# Patient Record
Sex: Male | Born: 1980 | Race: White | Hispanic: No | Marital: Married | Smoking: Former smoker
Health system: Southern US, Community
[De-identification: ages and names within clinical notes are randomized; demographics above are authoritative.]

## PROBLEM LIST (undated history)

## (undated) DIAGNOSIS — L309 Dermatitis, unspecified: Secondary | ICD-10-CM

## (undated) DIAGNOSIS — F419 Anxiety disorder, unspecified: Secondary | ICD-10-CM

## (undated) HISTORY — DX: Dermatitis, unspecified: L30.9

## (undated) HISTORY — DX: Anxiety disorder, unspecified: F41.9

## (undated) HISTORY — PX: EYE SURGERY: SHX253

---

## 2016-03-18 ENCOUNTER — Ambulatory Visit (INDEPENDENT_AMBULATORY_CARE_PROVIDER_SITE_OTHER): Payer: 59 | Admitting: Psychology

## 2016-03-18 DIAGNOSIS — F40248 Other situational type phobia: Secondary | ICD-10-CM

## 2016-04-01 ENCOUNTER — Ambulatory Visit (INDEPENDENT_AMBULATORY_CARE_PROVIDER_SITE_OTHER): Payer: 59 | Admitting: Psychology

## 2016-04-01 DIAGNOSIS — F40248 Other situational type phobia: Secondary | ICD-10-CM | POA: Diagnosis not present

## 2016-04-25 ENCOUNTER — Ambulatory Visit: Payer: 59 | Admitting: Psychology

## 2016-06-20 HISTORY — PX: MENISCUS REPAIR: SHX5179

## 2016-07-12 DIAGNOSIS — Z0000 Encounter for general adult medical examination without abnormal findings: Secondary | ICD-10-CM | POA: Diagnosis not present

## 2016-12-19 DIAGNOSIS — M25562 Pain in left knee: Secondary | ICD-10-CM | POA: Diagnosis not present

## 2017-01-04 DIAGNOSIS — M25562 Pain in left knee: Secondary | ICD-10-CM | POA: Diagnosis not present

## 2017-02-10 DIAGNOSIS — M25562 Pain in left knee: Secondary | ICD-10-CM | POA: Diagnosis not present

## 2017-02-22 DIAGNOSIS — M25562 Pain in left knee: Secondary | ICD-10-CM | POA: Diagnosis not present

## 2017-03-17 DIAGNOSIS — S83282A Other tear of lateral meniscus, current injury, left knee, initial encounter: Secondary | ICD-10-CM | POA: Diagnosis not present

## 2017-04-13 DIAGNOSIS — S83272A Complex tear of lateral meniscus, current injury, left knee, initial encounter: Secondary | ICD-10-CM | POA: Diagnosis not present

## 2017-04-13 DIAGNOSIS — G8918 Other acute postprocedural pain: Secondary | ICD-10-CM | POA: Diagnosis not present

## 2017-04-24 DIAGNOSIS — M25662 Stiffness of left knee, not elsewhere classified: Secondary | ICD-10-CM | POA: Diagnosis not present

## 2017-04-24 DIAGNOSIS — M6281 Muscle weakness (generalized): Secondary | ICD-10-CM | POA: Diagnosis not present

## 2017-05-03 DIAGNOSIS — M25512 Pain in left shoulder: Secondary | ICD-10-CM | POA: Diagnosis not present

## 2017-05-22 DIAGNOSIS — M25512 Pain in left shoulder: Secondary | ICD-10-CM | POA: Diagnosis not present

## 2017-05-30 DIAGNOSIS — M25512 Pain in left shoulder: Secondary | ICD-10-CM | POA: Diagnosis not present

## 2017-06-02 DIAGNOSIS — M25512 Pain in left shoulder: Secondary | ICD-10-CM | POA: Diagnosis not present

## 2017-06-06 DIAGNOSIS — D2362 Other benign neoplasm of skin of left upper limb, including shoulder: Secondary | ICD-10-CM | POA: Diagnosis not present

## 2017-06-06 DIAGNOSIS — L2084 Intrinsic (allergic) eczema: Secondary | ICD-10-CM | POA: Diagnosis not present

## 2017-06-27 DIAGNOSIS — M6281 Muscle weakness (generalized): Secondary | ICD-10-CM | POA: Diagnosis not present

## 2017-06-27 DIAGNOSIS — M25562 Pain in left knee: Secondary | ICD-10-CM | POA: Diagnosis not present

## 2017-07-13 DIAGNOSIS — Z0000 Encounter for general adult medical examination without abnormal findings: Secondary | ICD-10-CM | POA: Diagnosis not present

## 2017-07-14 DIAGNOSIS — M25562 Pain in left knee: Secondary | ICD-10-CM | POA: Diagnosis not present

## 2017-07-14 DIAGNOSIS — M6281 Muscle weakness (generalized): Secondary | ICD-10-CM | POA: Diagnosis not present

## 2017-07-24 DIAGNOSIS — M25562 Pain in left knee: Secondary | ICD-10-CM | POA: Diagnosis not present

## 2017-07-31 DIAGNOSIS — M25562 Pain in left knee: Secondary | ICD-10-CM | POA: Diagnosis not present

## 2017-08-02 DIAGNOSIS — M25562 Pain in left knee: Secondary | ICD-10-CM | POA: Diagnosis not present

## 2017-08-04 DIAGNOSIS — M25562 Pain in left knee: Secondary | ICD-10-CM | POA: Diagnosis not present

## 2017-08-04 DIAGNOSIS — M6281 Muscle weakness (generalized): Secondary | ICD-10-CM | POA: Diagnosis not present

## 2018-07-16 LAB — LAB REPORT - SCANNED
EGFR (Non-African Amer.): 108
PSA, Total: 0.62

## 2020-02-14 ENCOUNTER — Other Ambulatory Visit: Payer: Self-pay | Admitting: Ophthalmology

## 2020-02-14 DIAGNOSIS — H4912 Fourth [trochlear] nerve palsy, left eye: Secondary | ICD-10-CM

## 2020-03-19 ENCOUNTER — Ambulatory Visit
Admission: RE | Admit: 2020-03-19 | Discharge: 2020-03-19 | Disposition: A | Discharge status: Home / Self Care | Payer: Managed Care, Other (non HMO) | Source: Ambulatory Visit | Attending: Ophthalmology | Admitting: Ophthalmology

## 2020-03-19 ENCOUNTER — Other Ambulatory Visit: Payer: Self-pay

## 2020-03-19 DIAGNOSIS — H4912 Fourth [trochlear] nerve palsy, left eye: Secondary | ICD-10-CM

## 2020-03-19 IMAGING — MR MR ORBITS WO/W CM
4 of 6 series · 19 of 48 positions shown · IV contrast (multihance)
Comparison: Brain MRI today reported separately.

CLINICAL DATA: 39-year-old male with double vision. Left-side 4th
nerve palsy. Prior LASIK surgery about 3 years ago.

EXAM:
MRI OF THE ORBITS WITHOUT AND WITH CONTRAST
TECHNIQUE: Multiplanar, multisequence MR imaging of the orbits was performed
both before and after the administration of intravenous contrast.
CONTRAST:  20mL MULTIHANCE GADOBENATE DIMEGLUMINE 529 MG/ML IV SOLN
in conjunction with contrast enhanced imaging of the brain reported
separately.

[Series 14: T1 · coronal · 3.0mm · 0.35mm/px · 3 of 28 slices shown (1 of 2)]
[im 4/28]
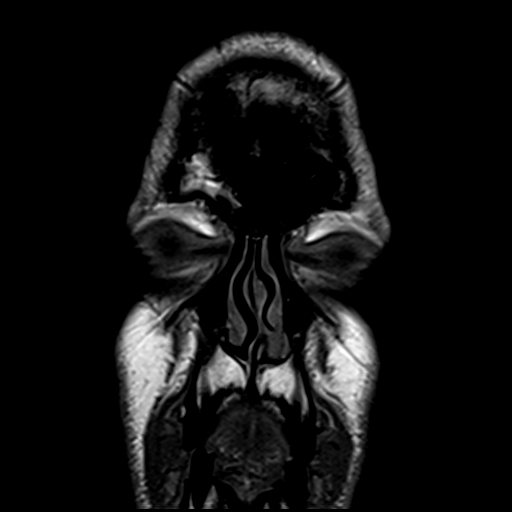
[im 16/28]
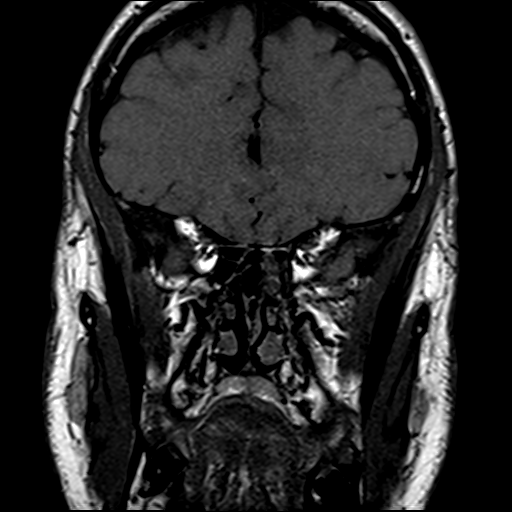
[im 25/28]
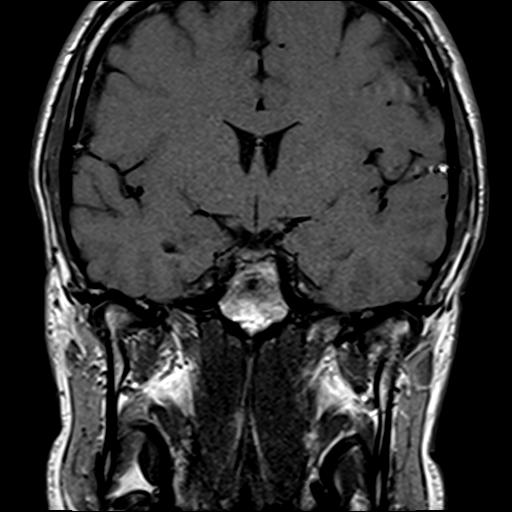

[Series 15: T1 · axial · 3.0mm · 0.35mm/px · z∈[-31,+15]mm · 3 of 18 slices shown (2 of 2)]
[im 4/18]
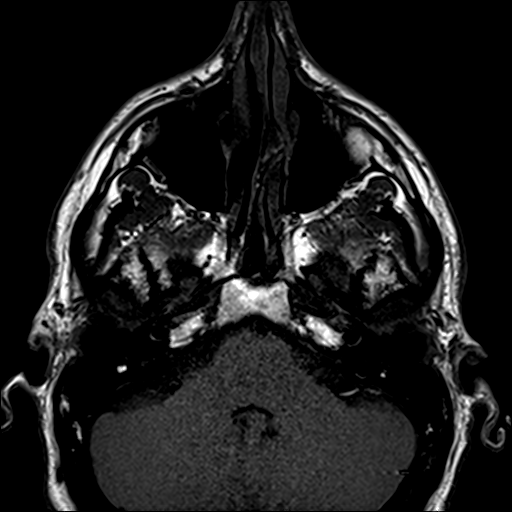
[im 11/18]
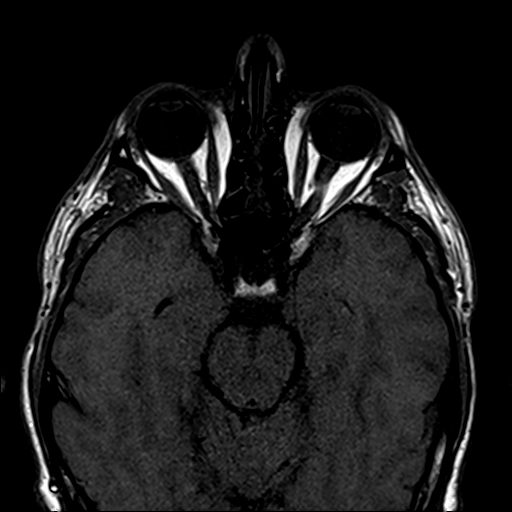
[im 18/18]
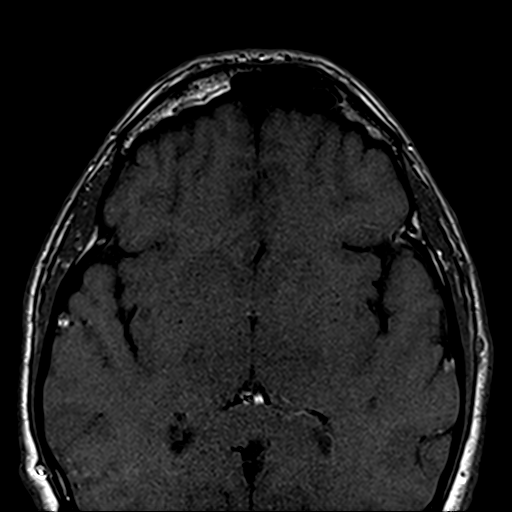

[Series 16: T2 fat-sat · coronal · 3.0mm · 0.35mm/px · 8 of 28 slices shown (1 of 2)]
[im 1/28]
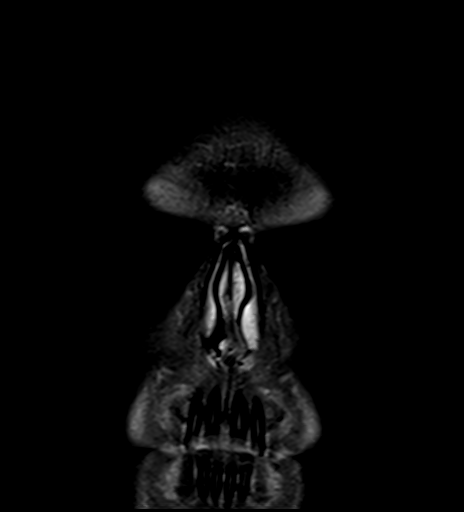
[im 4/28]
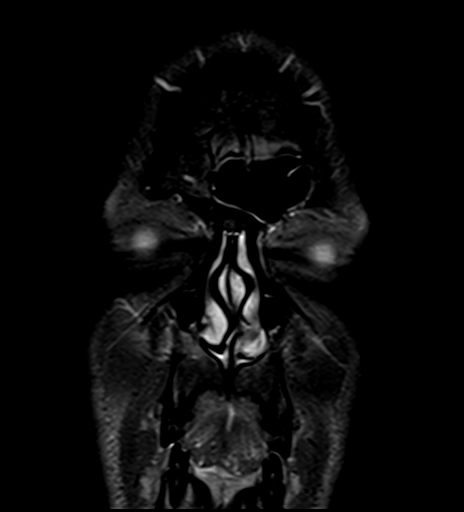
[im 10/28]
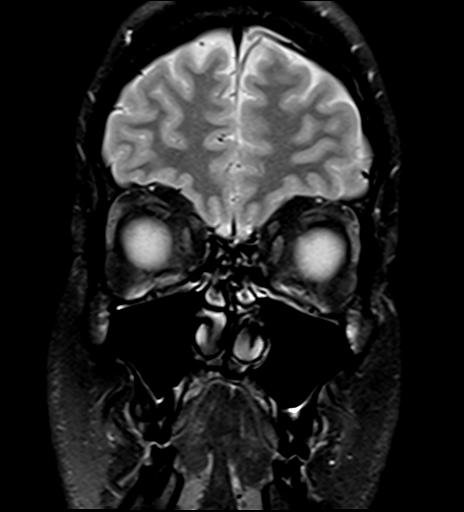
[im 13/28]
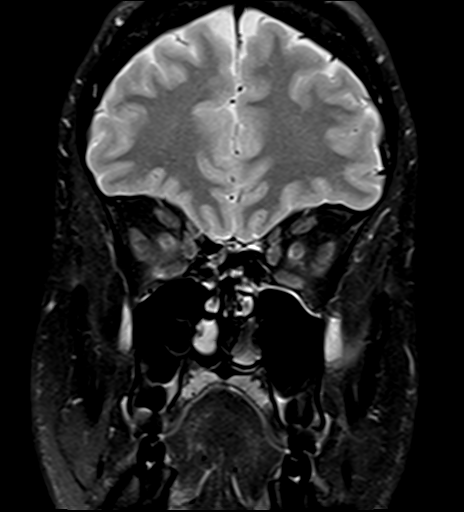
[im 16/28]
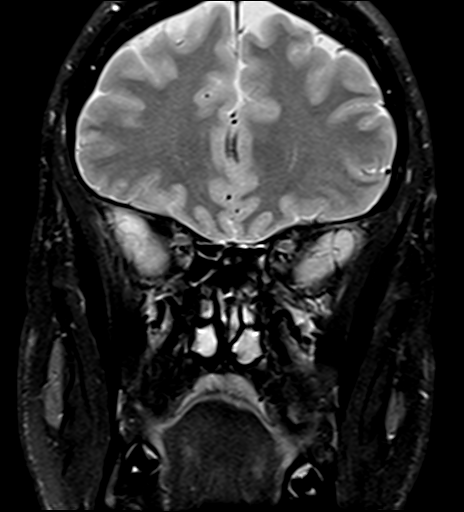
[im 19/28]
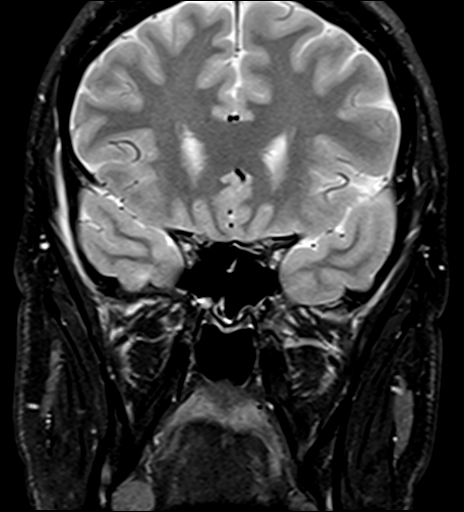
[im 25/28]
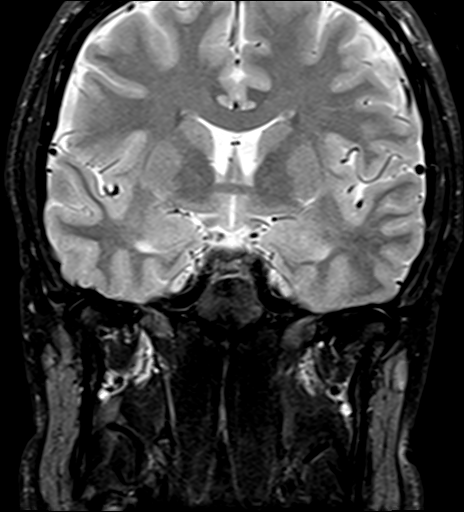
[im 28/28]
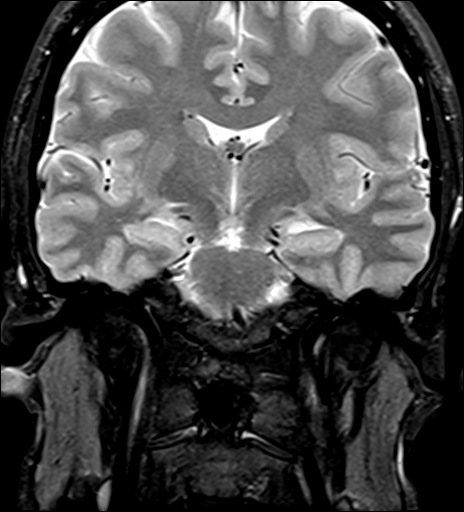

[Series 17: T2 fat-sat · axial · 3.0mm · 0.35mm/px · z∈[-41,+15]mm · 5 of 18 slices shown (2 of 2)]
[im 1/18]
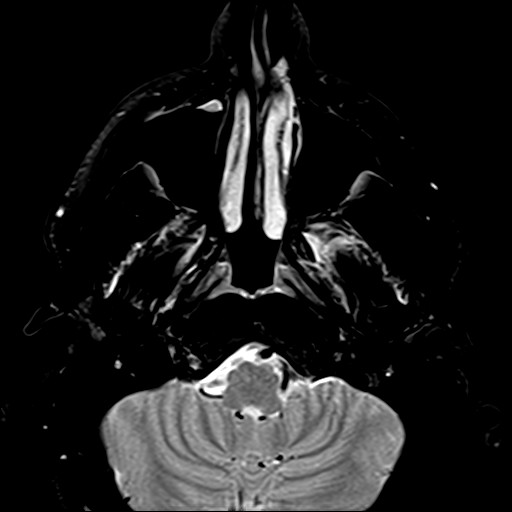
[im 4/18]
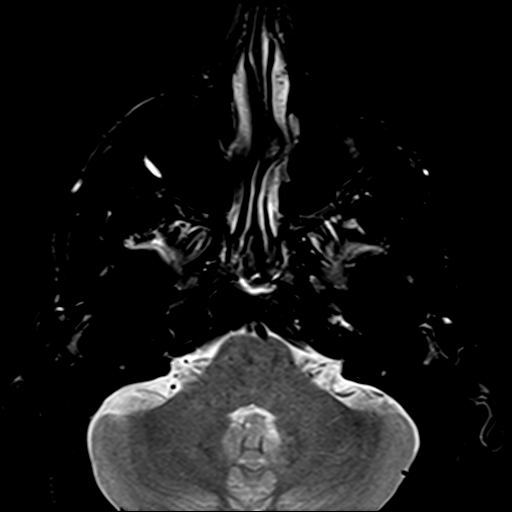
[im 7/18]
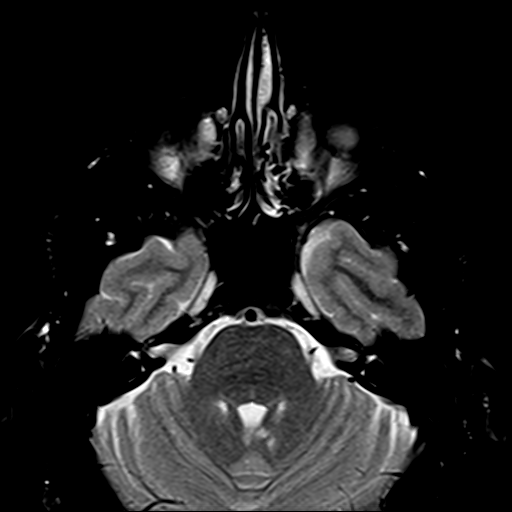
[im 11/18]
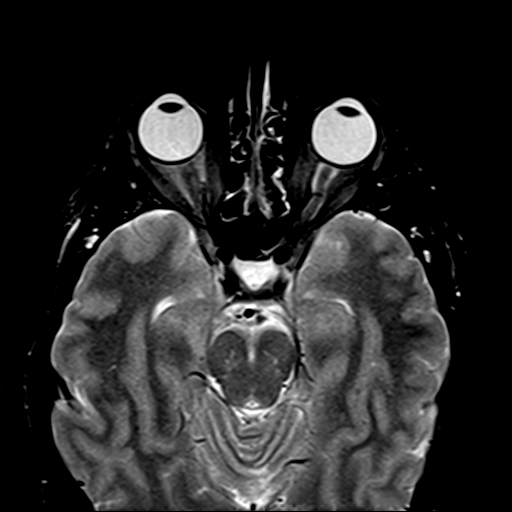
[im 18/18]
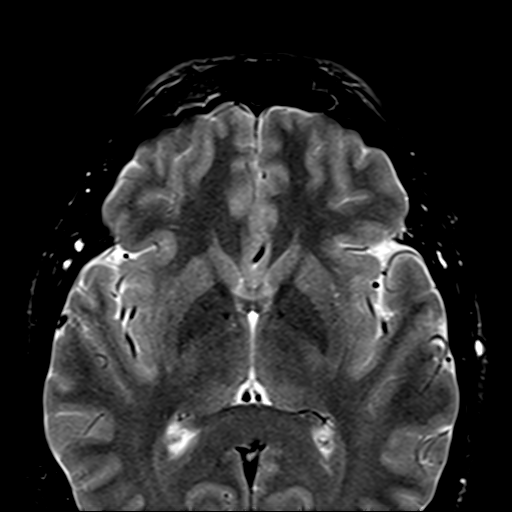

[19 of 48 positions shown; findings below may reference images not displayed]

FINDINGS: Patchy T2 hyperintensity redemonstrated in the dorsal superior
cerebellum near midline (series 17, image 9), see comparison.

No signal abnormality identified in the brainstem, including the
left midbrain. Visible deep gray nuclei also appear normal. No
abnormal enhancement of the visible brain parenchyma.

Normal suprasellar cistern and optic chiasm. Cavernous sinuses
appear symmetric and normal.

Major intracranial vascular flow voids are unremarkable. There is a
left posterior communicating artery identified on series 17 images 6
and 7, with no adverse features evident. There is a smaller right
posterior communicating artery also on image 7. The left ACA A1
segment is mildly tortuous.

Optic nerves and bilateral orbital apex appear symmetric and normal.
No intraorbital mass or abnormal enhancement. Symmetric extra-ocular
muscles and lacrimal glands. No intraorbital inflammation. Globes
appear symmetric and normal.

Trace bilateral paranasal sinus mucosal thickening appears
inconsequential. Superficial periorbital soft tissues as well as the
visible deep soft tissue spaces of the face appear normal.
IMPRESSION: 1. Normal MRI appearance of the orbits. No explanation for left 4th
nerve palsy.

2. The left posterior communicating artery is larger than the right,
but no intracranial aneurysm is evident. If symptoms remain
unexplained, Head MRA (noncontrast) or CTA Head (with contrast)
would best evaluate the vasculature.

## 2020-03-19 IMAGING — MR MR HEAD WO/W CM
12 series · 39 of 48 positions shown · IV contrast (multihance)
Comparison: Orbit MRI today is reported separately.

CLINICAL DATA: 39-year-old male with double vision. Left-side 4th
nerve palsy. Prior LASIK surgery about 3 years ago.

EXAM:
MRI HEAD WITHOUT AND WITH CONTRAST
TECHNIQUE: Multiplanar, multiecho pulse sequences of the brain and surrounding
structures were obtained without and with intravenous contrast.
CONTRAST:  20mL MULTIHANCE GADOBENATE DIMEGLUMINE 529 MG/ML IV SOLN

[Series 2: T1 · sagittal · 5.0mm · 0.45mm/px · 1 of 22 slices shown]
[im 1/22]
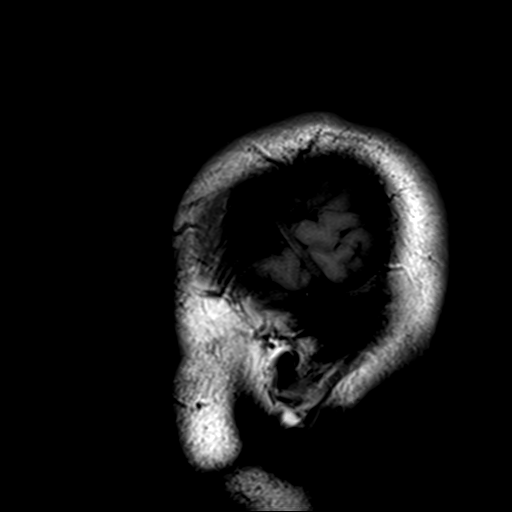

[Series 3: DWI · axial · 3.0mm · 1.88mm/px · z∈[-60,+96]mm · 5 of 106 slices shown (1 of 4)]
[im 1/106]
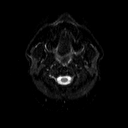
[im 27/106]
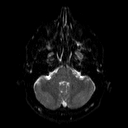
[im 53/106]
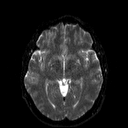
[im 79/106]
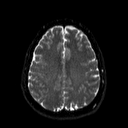
[im 106/106]
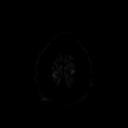

[Series 4: DWI · axial · 3.0mm · 1.88mm/px · z∈[-60,+96]mm · 3 of 52 slices shown (2 of 4)]
[im 1/52]
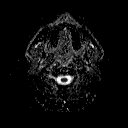
[im 26/52]
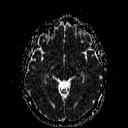
[im 52/52]
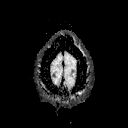

[Series 5: DWI · coronal · 5.0mm · 1.80mm/px · 4 of 72 slices shown (3 of 4)]
[im 1/72]
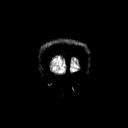
[im 24/72]
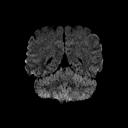
[im 48/72]
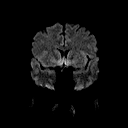
[im 72/72]
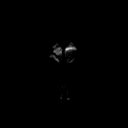

[Series 6: DWI · coronal · 5.0mm · 1.80mm/px · 2 of 36 slices shown (4 of 4)]
[im 1/36]
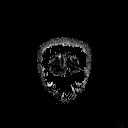
[im 36/36]
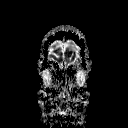

[Series 7: FLAIR · axial · 3.0mm · 0.45mm/px · z∈[-63,+98]mm · 2 of 28 slices shown]
[im 1/28]
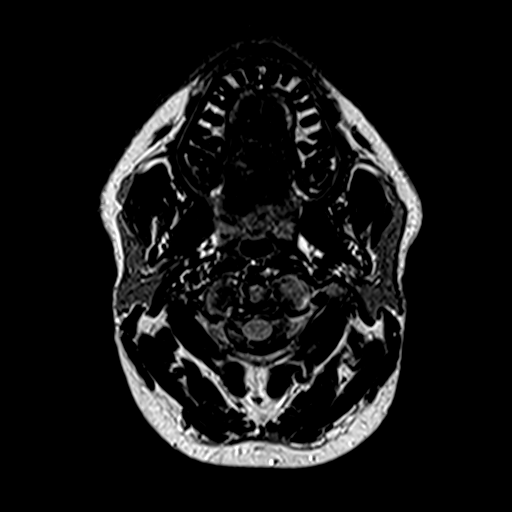
[im 28/28]
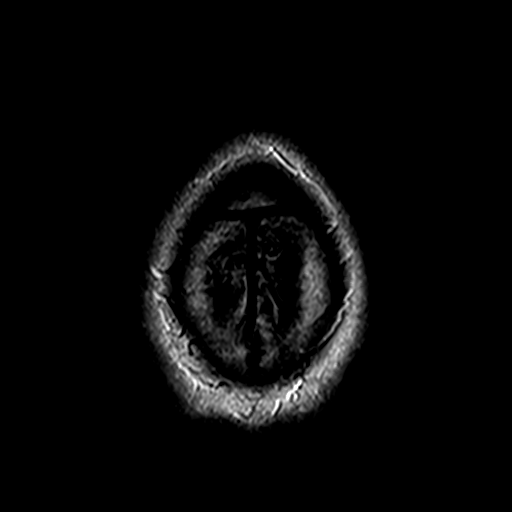

[Series 8: T2 · axial · 5.0mm · 0.51mm/px · z∈[-65,+97]mm · 2 of 27 slices shown (1 of 2)]
[im 1/27]
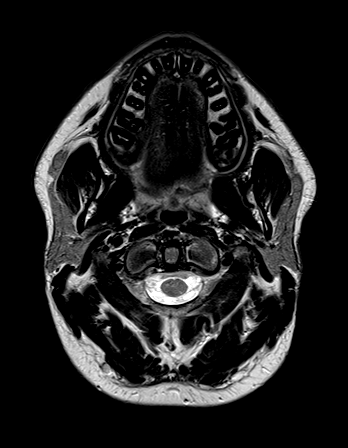
[im 27/27]
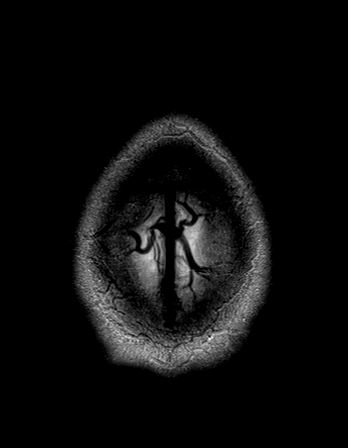

[Series 10: swi_images · axial · 2.0mm · 0.90mm/px · z∈[-63,+95]mm · 5 of 80 slices shown]
[im 1/80]
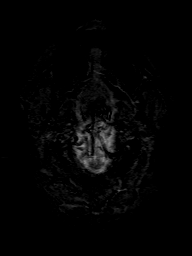
[im 20/80]
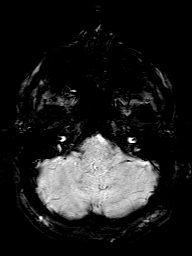
[im 40/80]
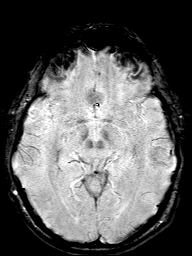
[im 60/80]
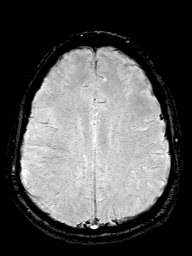
[im 80/80]
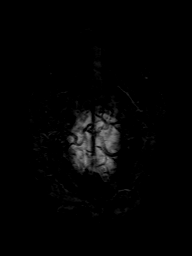

[Series 11: T2 · coronal · 5.0mm · 0.45mm/px · 2 of 30 slices shown (2 of 2)]
[im 1/30]
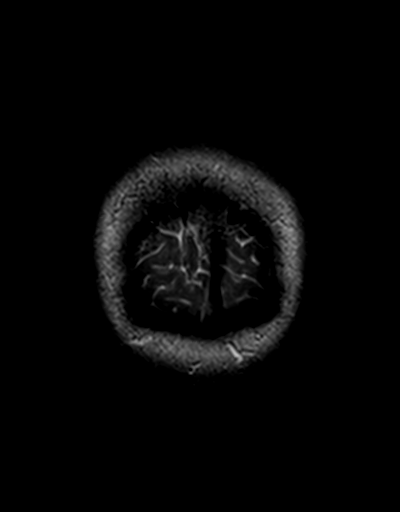
[im 30/30]
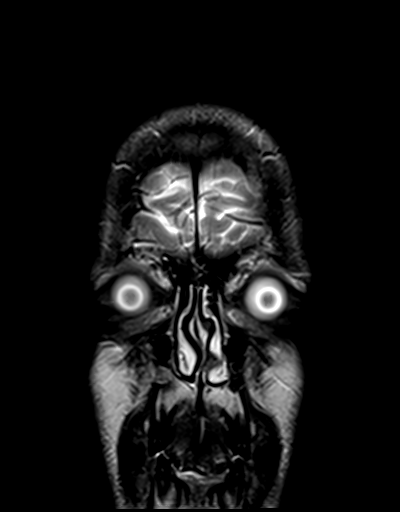

[Series 12: t1_mpr_tra copy center · axial · 1.0mm · 0.45mm/px · z∈[-63,+96]mm · 8 of 160 slices shown]
[im 1/160]
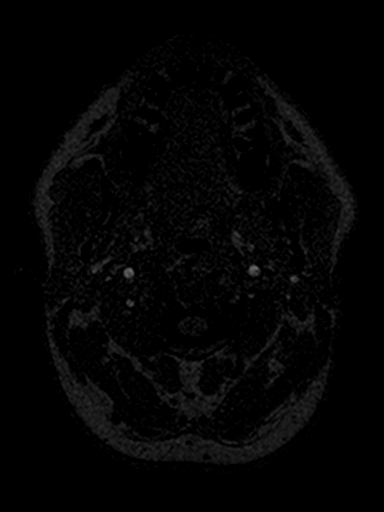
[im 18/160]
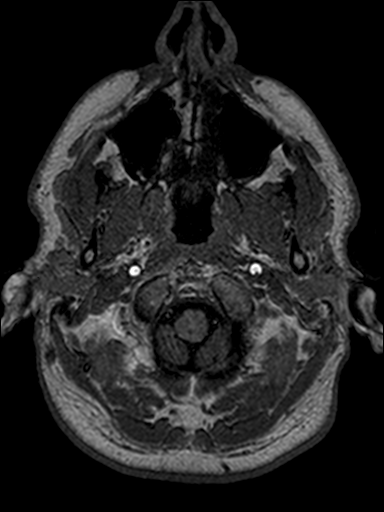
[im 54/160]
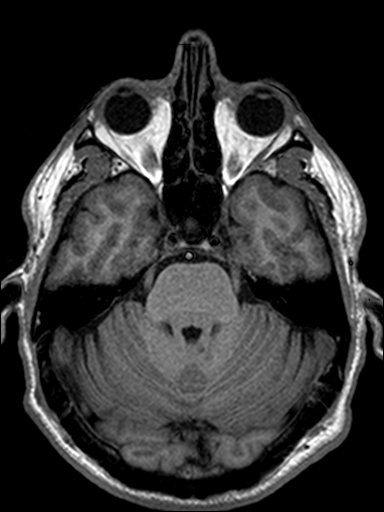
[im 71/160]
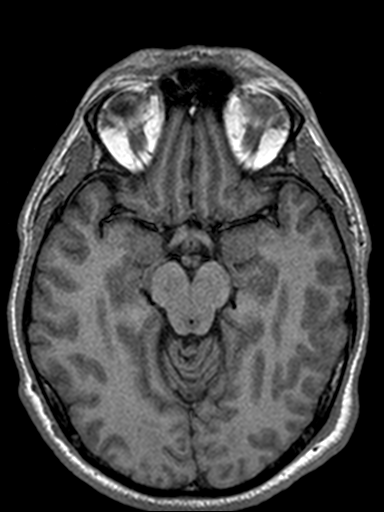
[im 89/160]
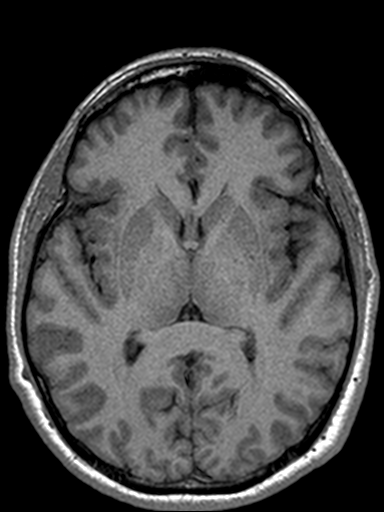
[im 107/160]
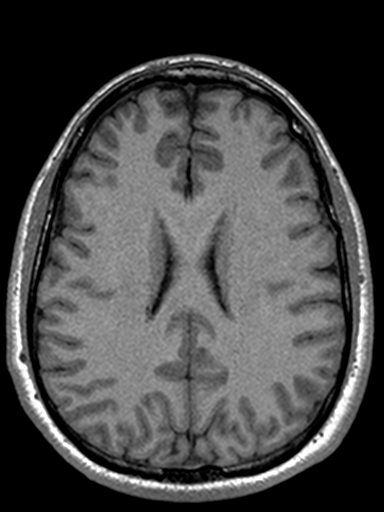
[im 142/160]
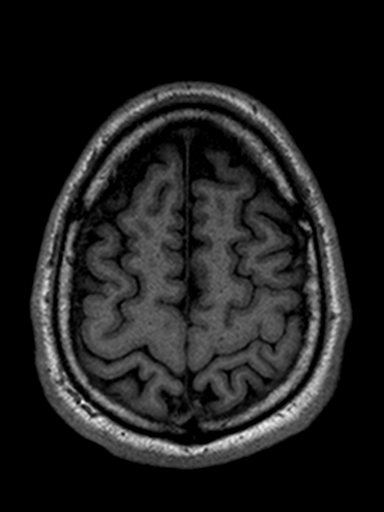
[im 160/160]
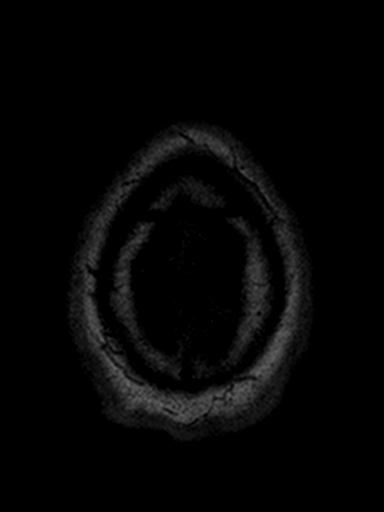

[Series 13: post_t1_mpr_tra · axial · 1.0mm · 0.45mm/px · z∈[-63,-10]mm · 3 of 160 slices shown]
[im 1/160]
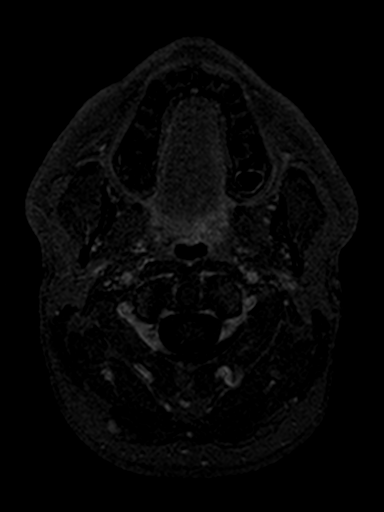
[im 18/160]
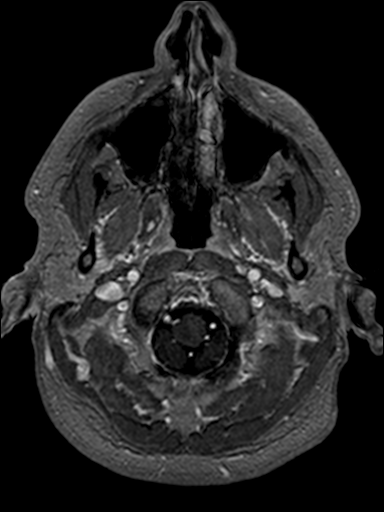
[im 54/160]
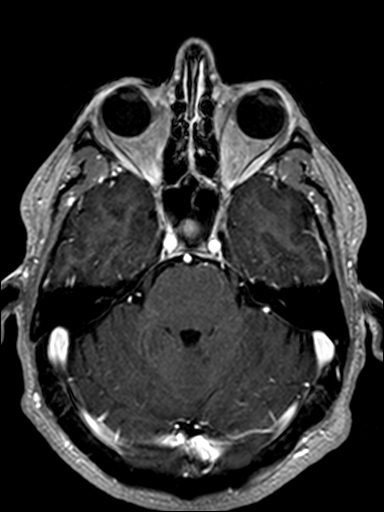

[Series 14: T1 post-contrast · coronal · 5.0mm · 0.45mm/px · 2 of 29 slices shown]
[im 1/29]
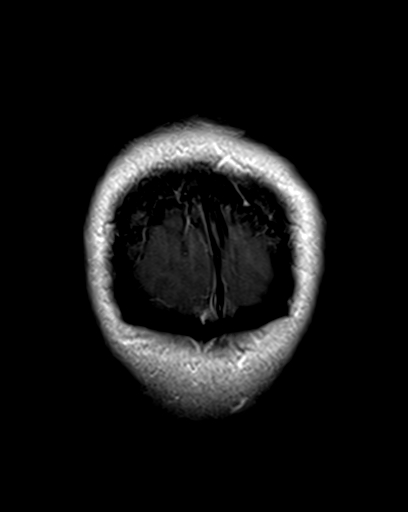
[im 29/29]
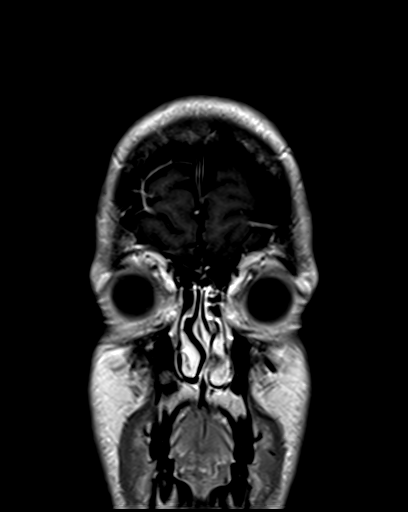

[39 of 48 positions shown; findings below may reference images not displayed]

FINDINGS: Brain: Normal cerebral volume. No restricted diffusion to suggest
acute infarction. No midline shift, mass effect, evidence of mass
lesion, ventriculomegaly, extra-axial collection or acute
intracranial hemorrhage. Cervicomedullary junction and pituitary are
within normal limits.

Supratentorial gray and white matter signal is within normal limits
for age throughout the brain.

No midbrain signal abnormality or lesion on these routine protocol
images, dedicated orbit MRI today is reported separately.

However, there is patchy T2 and FLAIR hyperintensity in the superior
left cerebellum near midline (series 7 images 10 through 12 and
series 11, image 10) tracking inferiorly toward the dorsal 4th
ventricle. No associated chronic cerebral blood products. No
abnormal enhancement there. Diffusion is facilitated. The remainder
of the cerebellum appears normal.

There is a small developmental venous anomaly of the left inferior
cerebellum (series 14, image 10), as well as a more subtle DVA of
the right superior cerebellum on series 14, image 11, but these
appear unrelated to the above signal changes.

There is also a fairly large and conspicuous developmental venous
anomaly of the right hemisphere on series 14 image 13, series 13,
image 117). And these are normal anatomic variations.

No abnormal intracranial enhancement or dural thickening identified.

Vascular: Major intracranial vascular flow voids are preserved. The
major dural venous sinuses are enhancing and appear to be patent.

Skull and upper cervical spine: Negative.

Sinuses/Orbits: Reported separately today.

Other: Mastoids are clear. Visible internal auditory structures
appear normal. Scalp and face appear negative.
IMPRESSION: 1. Orbit MRI is reported separately today.

2. No explanation for left 4th nerve palsy on routine MRI of the
Brain. Although there is an isolated small area of nonspecific
encephalomalacia in the dorsal cerebellum near the midline, which is
of unclear etiology and significance. Elsewhere the brain appears
normal.

3. No acute intracranial abnormality. Normal variant developmental
venous anomalies of the cerebellum and right cerebrum.

## 2020-03-19 MED ORDER — GADOBENATE DIMEGLUMINE 529 MG/ML IV SOLN
20.0000 mL | Freq: Once | INTRAVENOUS | Status: AC | PRN
  Administered 2020-03-19: 20 mL via INTRAVENOUS

## 2020-10-01 ENCOUNTER — Ambulatory Visit: Payer: Self-pay | Admitting: Allergy

## 2020-10-28 NOTE — Progress Notes (Signed)
New Patient Note  RE: William [name] MRN: [id number] DOB: 04-12-1981 Date of Office Visit: 10/29/2020  Consult requested by: No ref. provider found Primary care provider: Kristen Loader, FNP  Chief Complaint: Allergic Rhinitis   History of Present Illness: I had the pleasure of seeing William [name] for initial evaluation at the [facility] on 10/29/2020. He is a 40 y.o. male, who is self-referred here for the evaluation of allergic rhinitis.  He reports symptoms of rhinorrhea, sinus pressure, headaches, nasal congestion, itchy nose, PND, itchy/watery eyes. Symptoms have been going on for 11 years. The symptoms are present mainly in the spring. Other triggers include exposure to possibly pet dander. Anosmia: no. Headache: yes. He has used Claritin, Afrin prn with fair improvement in symptoms. Sinus infections: no. Previous work up includes: none. Previous ENT evaluation: no. Previous sinus imaging: no. History of nasal polyps: no. Last eye exam: last year. History of reflux: none.  Patient had questions about wheat sensitivity as his sister was recently diagnosed with it. He tolerates all food groups with no issues. Discussed that skin testing only checks for IgE mediated reactions which he does not have as he tolerates wheat now with no symptoms.   Assessment and Plan: William [name] is a 40 y.o. male with: Other allergic rhinitis Rhinoconjunctivitis symptoms for 11 years mainly in the spring.  Takes Claritin and Afrin with good benefit.  No prior allergy/ENT evaluation.  Today's skin testing showed: Positive to trees, grass, mold.  Start environmental control measures as below.  May use over the counter antihistamines such as Zyrtec (cetirizine), Claritin (loratadine), Allegra (fexofenadine), or Xyzal (levocetirizine) daily as needed.  Start taking daily from middle of February through May.  Consider allergy injections for long term control if above medications do not  help the symptoms - handout given.    Declines nasal sprays and eyedrops at this time.  Allergic conjunctivitis of both eyes  See assessment and plan as above for allergic rhinitis.  Oral allergy syndrome, subsequent encounter Noted perioral pruritus which cherries, nectarines, kiwi and jackfruit.  Today's skin testing showed: Borderline to peach.  No skin testing available to cherries, nectarines, kiwi and jackfruit in the office.   Continue to avoid foods that are bothersome - nectarines, kiwi, cherry, jackfruit.  Discussed that his food triggered oral and throat symptoms are likely caused by oral food allergy syndrome (OFAS). This is caused by cross reactivity of pollen with fresh fruits and vegetables, and nuts. Symptoms are usually localized in the form of itching and burning in mouth and throat. Very rarely it can progress to more severe symptoms. Eating foods in cooked or processed forms usually minimizes symptoms. I recommended avoidance of eating the problem foods, especially during the peak season(s). Sometimes, OFAS can induce severe throat swelling or even a systemic reaction; with such instance, I advised them to report to a local ER. A list of common pollens and food cross-reactivities was provided to the patient.   Adverse effect of other drugs, medicaments and biological substances, subsequent encounter Broke out in rash a few days after taking penicillin.  No other symptoms.  Consider penicillin allergy skin testing and in office drug challenge in the future.   Return in about 10 months (around 08/29/2021).  No orders of the defined types were placed in this encounter.  Lab Orders  No laboratory test(s) ordered today    Other allergy screening: Asthma: as a child. No inhaler use. Denies any SOB, wheezing,  chest tightness. Food allergy: yes  Cherries, nectarines, kiwi, jackfruit causes perioral pruritus.   Medication allergy: yes  Penicillin - rash on the leg a  few years ago.  Hymenoptera allergy: no Urticaria: no Eczema:no History of recurrent infections suggestive of immunodeficency: no  Diagnostics: Skin Testing: Environmental allergy panel and select foods. Positive to trees, grass, mold. Borderline to peach. Results discussed with patient/family.  Airborne Adult Perc - 10/29/20 1002    Time Antigen Placed 1005    Allergen Manufacturer Lavella Hammock    Location Back    Number of Test 59    1. Control-Buffer 50% Glycerol Negative    2. Control-Histamine 1 mg/ml 2+    3. Albumin saline Negative    4. [place] Negative    5. [place] Negative    6. Johnson Negative    7. Carl Blue Negative    8. Meadow Fescue Negative    9. Perennial Rye Negative    10. Sweet Vernal Negative    11. Timothy Negative    12. Cocklebur Negative    13. Burweed Marshelder Negative    14. Ragweed, short Negative    15. Ragweed, Giant Negative    16. Plantain,  English Negative    17. Lamb's Quarters Negative    18. Sheep Sorrell Negative    19. Rough Pigweed Negative    20. Marsh Elder, Rough Negative    21. Mugwort, Common Negative    22. Ash mix Negative    23. Birch mix 4+    24. Beech American 4+    25. Box, Elder 4+    26. Cedar, red Negative    27. Cottonwood, [place] Negative    28. Elm mix 2+    29. Hickory 2+    30. Maple mix 2+    31. Oak, [place] mix 4+    32. Pecan Pollen Negative    33. Pine mix Negative    34. Sycamore Eastern Negative    35. [place], Black Pollen Negative    36. Alternaria alternata 4+    37. Cladosporium Herbarum Negative    38. Aspergillus mix Negative    39. Penicillium mix Negative    40. Bipolaris sorokiniana (Helminthosporium) Negative    41. Drechslera spicifera (Curvularia) Negative    42. Mucor plumbeus Negative    43. Fusarium moniliforme Negative    44. Aureobasidium pullulans (pullulara) Negative    45. Rhizopus oryzae Negative    46. Botrytis cinera Negative    47. Epicoccum nigrum Negative    48.  Phoma betae Negative    49. Candida Albicans Negative    50. Trichophyton mentagrophytes Negative    51. Mite, D Farinae  5,000 AU/ml Negative    52. Mite, D Pteronyssinus  5,000 AU/ml Negative    53. Cat Hair 10,000 BAU/ml Negative    54.  Dog Epithelia Negative    55. Mixed Feathers Negative    56. Horse Epithelia Negative    57. Cockroach, German Negative    58. Mouse Negative    59. Tobacco Leaf Negative          Intradermal - 10/29/20 1033    Time Antigen Placed 1033    Allergen Manufacturer Lavella Hammock    Location Back    Number of Test 13    Intradermal Select    Control Negative    [place] 3+    Johnson 2+    7 Grass 2+    Ragweed mix Negative    Sherrie Mustache  mix Negative    Mold 2 2+    Mold 3 Negative    Mold 4 --   +/-   Cat Negative    Dog Negative    Cockroach Negative    Mite mix Negative          Food Adult Perc - 10/29/20 1000    Time Antigen Placed 1005    Allergen Manufacturer Greer    Location Back    Number of allergen test 1    59. Peach --   +/-          Past Medical History: Patient Active Problem List   Diagnosis Date Noted  . Other allergic rhinitis 10/29/2020  . Allergic conjunctivitis of both eyes 10/29/2020  . Oral allergy syndrome, subsequent encounter 10/29/2020  . Adverse effect of other drugs, medicaments and biological substances, subsequent encounter 10/29/2020   Past Medical History:  Diagnosis Date  . Eczema    Past Surgical History: History reviewed. No pertinent surgical history. Medication List:  Current Outpatient Medications  Medication Sig Dispense Refill  . ALPRAZolam (XANAX) 1 MG tablet 1 tablet    . naproxen sodium (ALEVE) 220 MG tablet 1-2 tablets with food or milk as needed     No current facility-administered medications for this visit.   Allergies: Allergies  Allergen Reactions  . Penicillins Anaphylaxis and Other (See Comments)   Social History: Social History   Socioeconomic History  . Marital status:  Married    Spouse name: Not on file  . Number of children: Not on file  . Years of education: Not on file  . Highest education level: Not on file  Occupational History  . Not on file  Tobacco Use  . Smoking status: Former Research scientist (life sciences)  . Smokeless tobacco: Never Used  Vaping Use  . Vaping Use: Never used  Substance and Sexual Activity  . Alcohol use: Yes  . Drug use: Never  . Sexual activity: Not on file  Other Topics Concern  . Not on file  Social History Narrative   ** Merged History Encounter **       Social Determinants of Health   Financial Resource Strain: Not on file  Food Insecurity: Not on file  Transportation Needs: Not on file  Physical Activity: Not on file  Stress: Not on file  Social Connections: Not on file   Lives in a 38 year old house. Smoking: denies Occupation: Counselling psychologist HistoryFreight forwarder in the house: no Charity fundraiser in the family room: no Carpet in the bedroom: yes Heating: gas Cooling: central Pet: yes 1 dog x 3 yrs  Family History: Family History  Problem Relation Age of Onset  . Allergic rhinitis Mother   . Allergic rhinitis Sister   . Allergic rhinitis Sister    Review of Systems  Constitutional: Negative for appetite change, chills, fever and unexpected weight change.  HENT: Positive for congestion, postnasal drip, rhinorrhea and sneezing.   Eyes: Positive for itching.  Respiratory: Negative for cough, chest tightness, shortness of breath and wheezing.   Cardiovascular: Negative for chest pain.  Gastrointestinal: Negative for abdominal pain.  Genitourinary: Negative for difficulty urinating.  Skin: Negative for rash.  Allergic/Immunologic: Positive for environmental allergies.  Neurological: Negative for headaches.   Objective: BP 110/70   Pulse 65   Temp 97.7 F (36.5 C) (Temporal)   Resp 16   Ht 5' 1.25" (1.556 m)   Wt 223 lb (101.2 kg)   SpO2 99%  BMI 41.79 kg/m  Body mass index is 41.79  kg/m. Physical Exam Vitals and nursing note reviewed.  Constitutional:      Appearance: Normal appearance. He is well-developed.  HENT:     Head: Normocephalic and atraumatic.     Right Ear: External ear normal.     Left Ear: External ear normal.     Nose: Nose normal.     Mouth/Throat:     Mouth: Mucous membranes are moist.     Pharynx: Oropharynx is clear.  Eyes:     Conjunctiva/sclera: Conjunctivae normal.  Cardiovascular:     Rate and Rhythm: Normal rate and regular rhythm.     Heart sounds: Normal heart sounds. No murmur heard. No friction rub. No gallop.   Pulmonary:     Effort: Pulmonary effort is normal.     Breath sounds: Normal breath sounds. No wheezing, rhonchi or rales.  Abdominal:     Palpations: Abdomen is soft.  Musculoskeletal:     Cervical back: Neck supple.  Skin:    General: Skin is warm.     Findings: No rash.  Neurological:     Mental Status: He is alert and oriented to person, place, and time.  Psychiatric:        Behavior: Behavior normal.    The plan was reviewed with the patient/family, and all questions/concerned were addressed.  It was my pleasure to see William [name] today and participate in his care. Please feel free to contact me with any questions or concerns.  Sincerely,  Rexene Alberts, DO Allergy & Immunology  [facility] office: [place] office: [phone]

## 2020-10-29 ENCOUNTER — Encounter: Payer: Self-pay | Admitting: Allergy

## 2020-10-29 ENCOUNTER — Other Ambulatory Visit: Payer: Self-pay

## 2020-10-29 ENCOUNTER — Ambulatory Visit (INDEPENDENT_AMBULATORY_CARE_PROVIDER_SITE_OTHER): Payer: Commercial Managed Care - PPO | Admitting: Allergy

## 2020-10-29 VITALS — BP 110/70 | HR 65 | Temp 97.7°F | Resp 16 | Ht 61.25 in | Wt 223.0 lb

## 2020-10-29 DIAGNOSIS — T50995D Adverse effect of other drugs, medicaments and biological substances, subsequent encounter: Secondary | ICD-10-CM | POA: Insufficient documentation

## 2020-10-29 DIAGNOSIS — H1013 Acute atopic conjunctivitis, bilateral: Secondary | ICD-10-CM

## 2020-10-29 DIAGNOSIS — J3089 Other allergic rhinitis: Secondary | ICD-10-CM

## 2020-10-29 DIAGNOSIS — T781XXD Other adverse food reactions, not elsewhere classified, subsequent encounter: Secondary | ICD-10-CM

## 2020-10-29 NOTE — Patient Instructions (Addendum)
Today's skin testing showed: Positive to trees, grass, mold. Borderline to peach. Results given.   Environmental allergies  Start environmental control measures as below.  May use over the counter antihistamines such as Zyrtec (cetirizine), Claritin (loratadine), Allegra (fexofenadine), or Xyzal (levocetirizine) daily as needed.  Start taking daily from middle of February through May.  Consider allergy injections for long term control if above medications do not help the symptoms - handout given.   Food:  Continue to avoid foods that are bothersome - nectarines, kiwi, cherry, jackfruit.  Discussed that his food triggered oral and throat symptoms are likely caused by oral food allergy syndrome (OFAS). This is caused by cross reactivity of pollen with fresh fruits and vegetables, and nuts. Symptoms are usually localized in the form of itching and burning in mouth and throat. Very rarely it can progress to more severe symptoms. Eating foods in cooked or processed forms usually minimizes symptoms. I recommended avoidance of eating the problem foods, especially during the peak season(s). Sometimes, OFAS can induce severe throat swelling or even a systemic reaction; with such instance, I advised them to report to a local ER. A list of common pollens and food cross-reactivities was provided to the patient.   Penicillin allergy:  Consider penicillin allergy skin testing and in office drug challenge in the future.   Over 90% of people with history of penicillin allergy which occurred over 10 years ago are found to be non-allergic.   You must be off antihistamines for 3-5 days before. Plan on being in the office for 2-3 hours. You must call to schedule an appointment and specify it's for a drug challenge.   A few days prior to the appointment, I will send in a prescription for amoxicillin liquid which you must bring to the appointment as well.   Follow up in 10 months or sooner if needed.    Reducing Pollen Exposure . Pollen seasons: trees (spring), grass (summer) and ragweed/weeds (fall). Marland Kitchen Keep windows closed in your home and car to lower pollen exposure.  Susa Simmonds air conditioning in the bedroom and throughout the house if possible.  . Avoid going out in dry windy days - especially early morning. . Pollen counts are highest between 5 - 10 AM and on dry, hot and windy days.  . Save outside activities for late afternoon or after a heavy rain, when pollen levels are lower.  . Avoid mowing of grass if you have grass pollen allergy. Marland Kitchen Be aware that pollen can also be transported indoors on people and pets.  . Dry your clothes in an automatic dryer rather than hanging them outside where they might collect pollen.  . Rinse hair and eyes before bedtime.  Mold Control . Mold and fungi can grow on a variety of surfaces provided certain temperature and moisture conditions exist.  . Outdoor molds grow on plants, decaying vegetation and soil. The major outdoor mold, Alternaria and Cladosporium, are found in very high numbers during hot and dry conditions. Generally, a late summer - fall peak is seen for common outdoor fungal spores. Rain will temporarily lower outdoor mold spore count, but counts rise rapidly when the rainy period ends. . The most important indoor molds are Aspergillus and Penicillium. Dark, humid and poorly ventilated basements are ideal sites for mold growth. The next most common sites of mold growth are the bathroom and the kitchen. Outdoor (Seasonal) Mold Control . Use air conditioning and keep windows closed. . Avoid exposure to decaying vegetation. Marland Kitchen  Avoid leaf raking. . Avoid grain handling. . Consider wearing a face mask if working in moldy areas.  Indoor (Perennial) Mold Control  . Maintain humidity below 50%. . Get rid of mold growth on hard surfaces with water, detergent and, if necessary, 5% bleach (do not mix with other cleaners). Then dry the area  completely. If mold covers an area more than 10 square feet, consider hiring an indoor environmental professional. . For clothing, washing with soap and water is best. If moldy items cannot be cleaned and dried, throw them away. . Remove sources e.g. contaminated carpets. . Repair and seal leaking roofs or pipes. Using dehumidifiers in damp basements may be helpful, but empty the water and clean units regularly to prevent mildew from forming. All rooms, especially basements, bathrooms and kitchens, require ventilation and cleaning to deter mold and mildew growth. Avoid carpeting on concrete or damp floors, and storing items in damp areas.

## 2020-10-29 NOTE — Assessment & Plan Note (Signed)
Rhinoconjunctivitis symptoms for 11 years mainly in the spring.  Takes Claritin and Afrin with good benefit.  No prior allergy/ENT evaluation.  Today's skin testing showed: Positive to trees, grass, mold.  Start environmental control measures as below.  May use over the counter antihistamines such as Zyrtec (cetirizine), Claritin (loratadine), Allegra (fexofenadine), or Xyzal (levocetirizine) daily as needed.  Start taking daily from middle of February through May.  Consider allergy injections for long term control if above medications do not help the symptoms - handout given.    Declines nasal sprays and eyedrops at this time.

## 2020-10-29 NOTE — Assessment & Plan Note (Signed)
Broke out in rash a few days after taking penicillin.  No other symptoms.  Consider penicillin allergy skin testing and in office drug challenge in the future.

## 2020-10-29 NOTE — Assessment & Plan Note (Signed)
   See assessment and plan as above for allergic rhinitis.  

## 2020-10-29 NOTE — Assessment & Plan Note (Signed)
Noted perioral pruritus which cherries, nectarines, kiwi and jackfruit.  Today's skin testing showed: Borderline to peach.  No skin testing available to cherries, nectarines, kiwi and jackfruit in the office.   Continue to avoid foods that are bothersome - nectarines, kiwi, cherry, jackfruit.  Discussed that his food triggered oral and throat symptoms are likely caused by oral food allergy syndrome (OFAS). This is caused by cross reactivity of pollen with fresh fruits and vegetables, and nuts. Symptoms are usually localized in the form of itching and burning in mouth and throat. Very rarely it can progress to more severe symptoms. Eating foods in cooked or processed forms usually minimizes symptoms. I recommended avoidance of eating the problem foods, especially during the peak season(s). Sometimes, OFAS can induce severe throat swelling or even a systemic reaction; with such instance, I advised them to report to a local ER. A list of common pollens and food cross-reactivities was provided to the patient.

## 2020-11-27 ENCOUNTER — Ambulatory Visit: Payer: Self-pay | Admitting: Family Medicine

## 2020-12-14 NOTE — Progress Notes (Signed)
[place] Phone: [phone] Subjective:   Fontaine No, am serving as a scribe for Dr. Hulan Saas.  This visit occurred during the SARS-CoV-2 public health emergency.  Safety protocols were in place, including screening questions prior to the visit, additional usage of staff PPE, and extensive cleaning of exam room while observing appropriate contact time as indicated for disinfecting solutions.    I'm seeing this patient by the request  of:  Kristen Loader, FNP  CC: back pain   [id number]  William [name] is a 40 y.o. male coming in with complaint of back pain for past 3 years. Has had MRI that showed herniated disc on two different occasions as over the years his back pain has waxed and waned. Was seen by Percell Miller and Noemi Chapel. Patient has decrease in range of motion in lumbar spine. Did suffer muscle spasm in last week. Able to keep working out but trying to make modifications. Pain mostly on L side of lumbar spine that can travel all the way down to his foot. Takes Aleve and has tried a couple of rounds of PT.       Past Medical History:  Diagnosis Date   Eczema    No past surgical history on file. Social History   Socioeconomic History   Marital status: Married    Spouse name: Not on file   Number of children: Not on file   Years of education: Not on file   Highest education level: Not on file  Occupational History   Not on file  Tobacco Use   Smoking status: Former    Pack years: 0.00   Smokeless tobacco: Never  Vaping Use   Vaping Use: Never used  Substance and Sexual Activity   Alcohol use: Yes   Drug use: Never   Sexual activity: Not on file  Other Topics Concern   Not on file  Social History Narrative   ** Merged History Encounter **       Social Determinants of Health   Financial Resource Strain: Not on file  Food Insecurity: Not on file  Transportation Needs: Not on file   Physical Activity: Not on file  Stress: Not on file  Social Connections: Not on file   Allergies  Allergen Reactions   Penicillins Anaphylaxis and Other (See Comments)   Family History  Problem Relation Age of Onset   Allergic rhinitis Mother    Allergic rhinitis Sister    Allergic rhinitis Sister        Current Outpatient Medications (Analgesics):    naproxen sodium (ALEVE) 220 MG tablet, 1-2 tablets with food or milk as needed   Current Outpatient Medications (Other):    ALPRAZolam (XANAX) 1 MG tablet, 1 tablet   tiZANidine (ZANAFLEX) 2 MG tablet, Take 1 tablet (2 mg total) by mouth at bedtime.   Reviewed prior external information including notes and imaging from  primary care provider As well as notes that were available from care everywhere and other healthcare systems.  Past medical history, social, surgical and family history all reviewed in electronic medical record.  No pertanent information unless stated regarding to the chief complaint.   Review of Systems:  No headache, visual changes, nausea, vomiting, diarrhea, constipation, dizziness, abdominal pain, skin rash, fevers, chills, night sweats, weight loss, swollen lymph nodes, body aches, joint swelling, chest pain, shortness of breath, mood changes. POSITIVE muscle aches  Objective  Blood pressure 118/82, pulse  69, height 6\' 1"  (1.854 m), weight 224 lb (101.6 kg), SpO2 98 %.   General: No apparent distress alert and oriented x3 mood and affect normal, dressed appropriately.  HEENT: Pupils equal, extraocular movements intact  Respiratory: Patient's speak in full sentences and does not appear short of breath  Cardiovascular: No lower extremity edema, non tender, no erythema  Gait normal with good balance and coordination.  MSK:  Non tender with full range of motion and good stability and symmetric strength and tone of shoulders, elbows, wrist, hip, knee and ankles bilaterally.  Significant loss of lordosis.   Patient does have some tightness with [name] the left side.  Tenderness in the paraspinal region of the lumbar spine and the sacroiliac joint.  Minimal range of motion in all planes of the lumbar spine.  Patient does have loss of lordosis.  5-5 strength bilateral lower extremity noted with deep tendon reflexes intact.  [id number]; 15 additional minutes spent for Therapeutic exercises as stated in above notes.  This included exercises focusing on stretching, strengthening, with significant focus on eccentric aspects.   Long term goals include an improvement in range of motion, strength, endurance as well as avoiding reinjury. Patient's frequency would include in 1-2 times a day, 3-5 times a week for a duration of 6-12 weeks. Low back exercises that included:  Pelvic tilt/bracing instruction to focus on control of the pelvic girdle and lower abdominal muscles  Glute strengthening exercises, focusing on proper firing of the glutes without engaging the low back muscles Proper stretching techniques for maximum relief for the hamstrings, hip flexors, low back and some rotation where tolerated  Proper technique shown and discussed handout in great detail with ATC.  All questions were discussed and answered.      Impression and Recommendations:     The above documentation has been reviewed and is accurate and complete Lyndal Pulley, DO

## 2020-12-15 ENCOUNTER — Encounter: Payer: Self-pay | Admitting: Family Medicine

## 2020-12-15 ENCOUNTER — Ambulatory Visit (INDEPENDENT_AMBULATORY_CARE_PROVIDER_SITE_OTHER): Payer: Commercial Managed Care - PPO | Admitting: Family Medicine

## 2020-12-15 ENCOUNTER — Other Ambulatory Visit: Payer: Self-pay

## 2020-12-15 ENCOUNTER — Ambulatory Visit (INDEPENDENT_AMBULATORY_CARE_PROVIDER_SITE_OTHER): Payer: Commercial Managed Care - PPO

## 2020-12-15 VITALS — BP 118/82 | HR 69 | Ht 73.0 in | Wt 224.0 lb

## 2020-12-15 DIAGNOSIS — M545 Low back pain, unspecified: Secondary | ICD-10-CM

## 2020-12-15 DIAGNOSIS — M2550 Pain in unspecified joint: Secondary | ICD-10-CM

## 2020-12-15 DIAGNOSIS — G8929 Other chronic pain: Secondary | ICD-10-CM | POA: Diagnosis not present

## 2020-12-15 LAB — COMPREHENSIVE METABOLIC PANEL
ALT: 14 U/L (ref 0–53)
AST: 19 U/L (ref 0–37)
Albumin: 4.7 g/dL (ref 3.5–5.2)
Alkaline Phosphatase: 50 U/L (ref 39–117)
BUN: 15 mg/dL (ref 6–23)
CO2: 29 mEq/L (ref 19–32)
Calcium: 9.3 mg/dL (ref 8.4–10.5)
Chloride: 102 mEq/L (ref 96–112)
Creatinine, Ser: 0.83 mg/dL (ref 0.40–1.50)
GFR: 111.29 mL/min (ref >60.00)
Glucose, Bld: 72 mg/dL (ref 70–99)
Potassium: 3.6 mEq/L (ref 3.5–5.1)
Sodium: 139 mEq/L (ref 135–145)
Total Bilirubin: 0.6 mg/dL (ref 0.2–1.2)
Total Protein: 7 g/dL (ref 6.0–8.3)

## 2020-12-15 LAB — CBC WITH DIFFERENTIAL/PLATELET
Basophils Absolute: 0 10*3/uL (ref 0.0–0.1)
Basophils Relative: 0.5 % (ref 0.0–3.0)
Eosinophils Absolute: 0.3 10*3/uL (ref 0.0–0.7)
Eosinophils Relative: 3.2 % (ref 0.0–5.0)
HCT: 41.9 % (ref 39.0–52.0)
Hemoglobin: 14.4 g/dL (ref 13.0–17.0)
Lymphocytes Relative: 25.3 % (ref 12.0–46.0)
Lymphs Abs: 2 10*3/uL (ref 0.7–4.0)
MCHC: 34.5 g/dL (ref 30.0–36.0)
MCV: 85.4 fl (ref 78.0–100.0)
Monocytes Absolute: 0.5 10*3/uL (ref 0.1–1.0)
Monocytes Relative: 6.6 % (ref 3.0–12.0)
Neutro Abs: 5.1 10*3/uL (ref 1.4–7.7)
Neutrophils Relative %: 64.4 % (ref 43.0–77.0)
Platelets: 193 10*3/uL (ref 150.0–400.0)
RBC: 4.91 Mil/uL (ref 4.22–5.81)
RDW: 12.5 % (ref 11.5–15.5)
WBC: 7.9 10*3/uL (ref 4.0–10.5)

## 2020-12-15 LAB — SEDIMENTATION RATE: Sed Rate: 5 mm/hr (ref 0–15)

## 2020-12-15 LAB — IBC PANEL
Iron: 123 ug/dL (ref 42–165)
Saturation Ratios: 35.4 % (ref 20.0–50.0)
Transferrin: 248 mg/dL (ref 212.0–360.0)

## 2020-12-15 LAB — C-REACTIVE PROTEIN: CRP: <1 mg/dL (ref 0.5–20.0)

## 2020-12-15 LAB — URIC ACID: Uric Acid, Serum: 5.2 mg/dL (ref 4.0–7.8)

## 2020-12-15 LAB — VITAMIN D 25 HYDROXY (VIT D DEFICIENCY, FRACTURES): VITD: 15.79 ng/mL — ABNORMAL LOW (ref 30.00–100.00)

## 2020-12-15 LAB — TSH: TSH: 1 u[IU]/mL (ref 0.35–4.50)

## 2020-12-15 LAB — TESTOSTERONE: Testosterone: 401.29 ng/dL (ref 300.00–890.00)

## 2020-12-15 IMAGING — DX DG LUMBAR SPINE 2-3V
3 series · 3 of 3 positions shown · non-contrast
Comparison: [DATE].

CLINICAL DATA: Chronic low back pain.

EXAM:
LUMBAR SPINE - 2-3 VIEW

[l-spine ap]
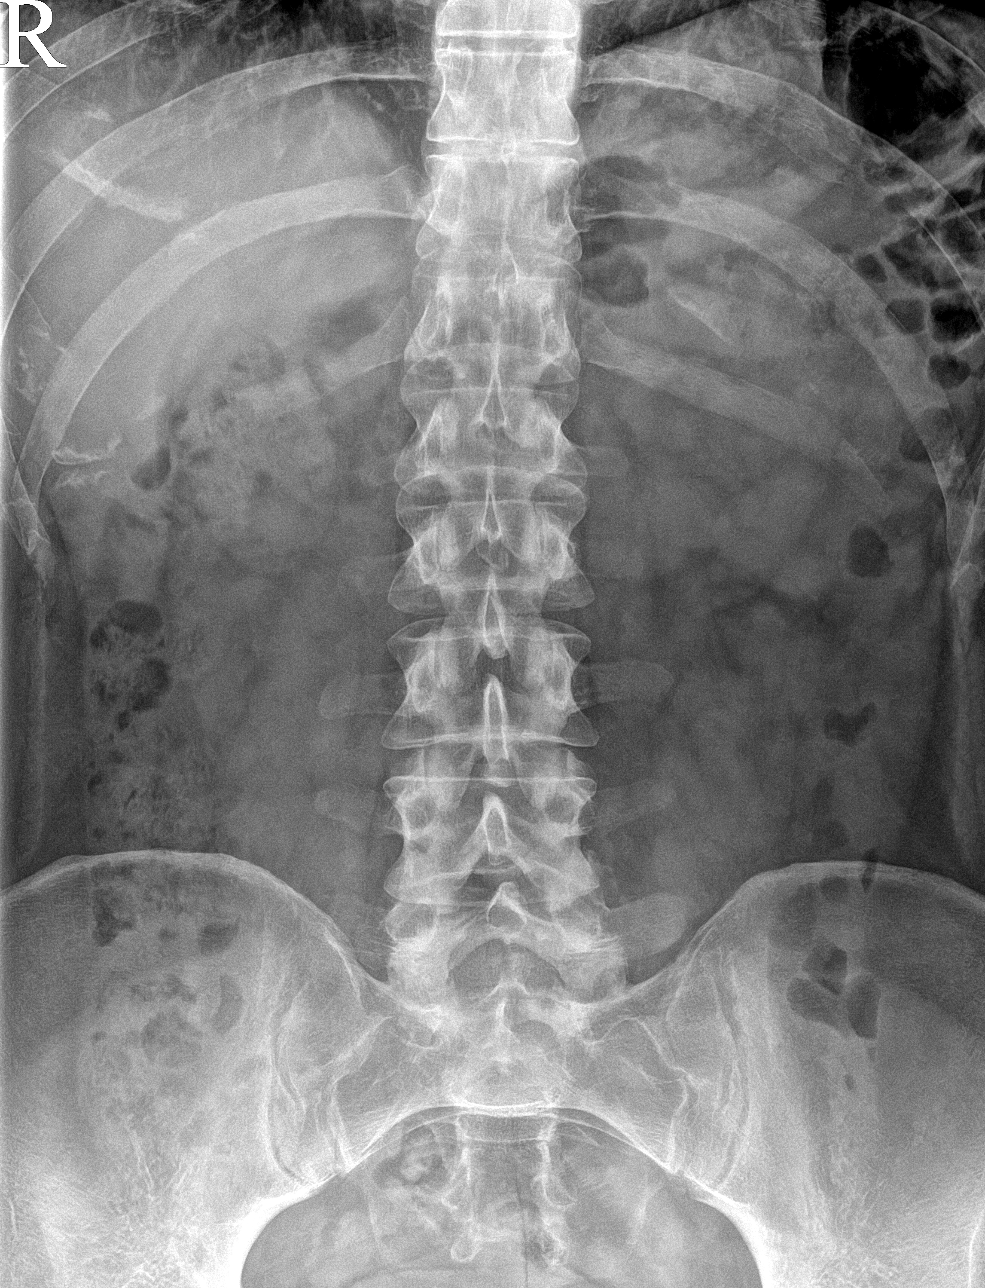

[l-spine lateral (1 of 2)]
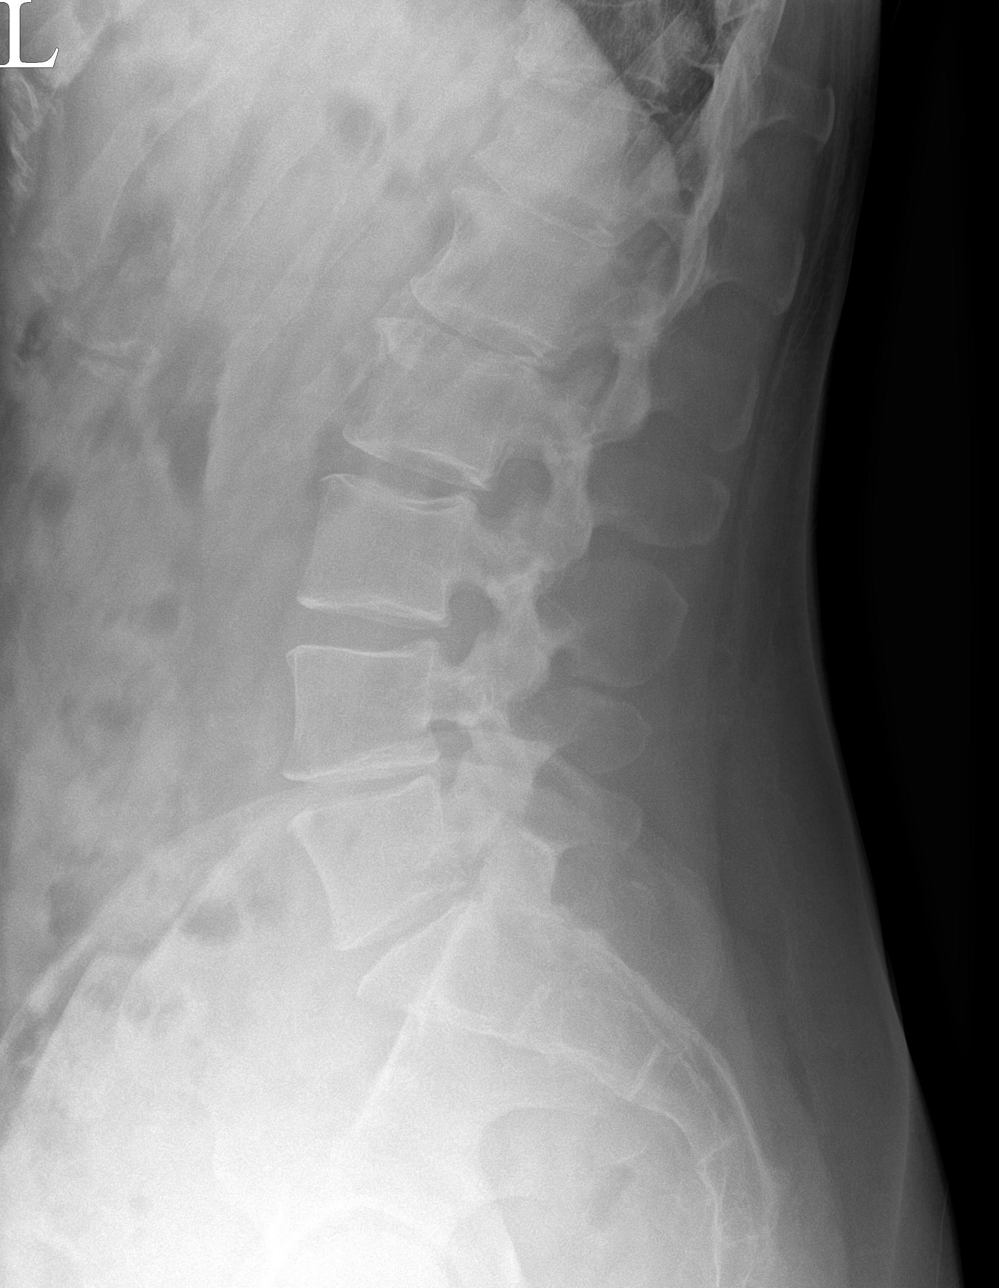

[l-spine lateral (2 of 2)]
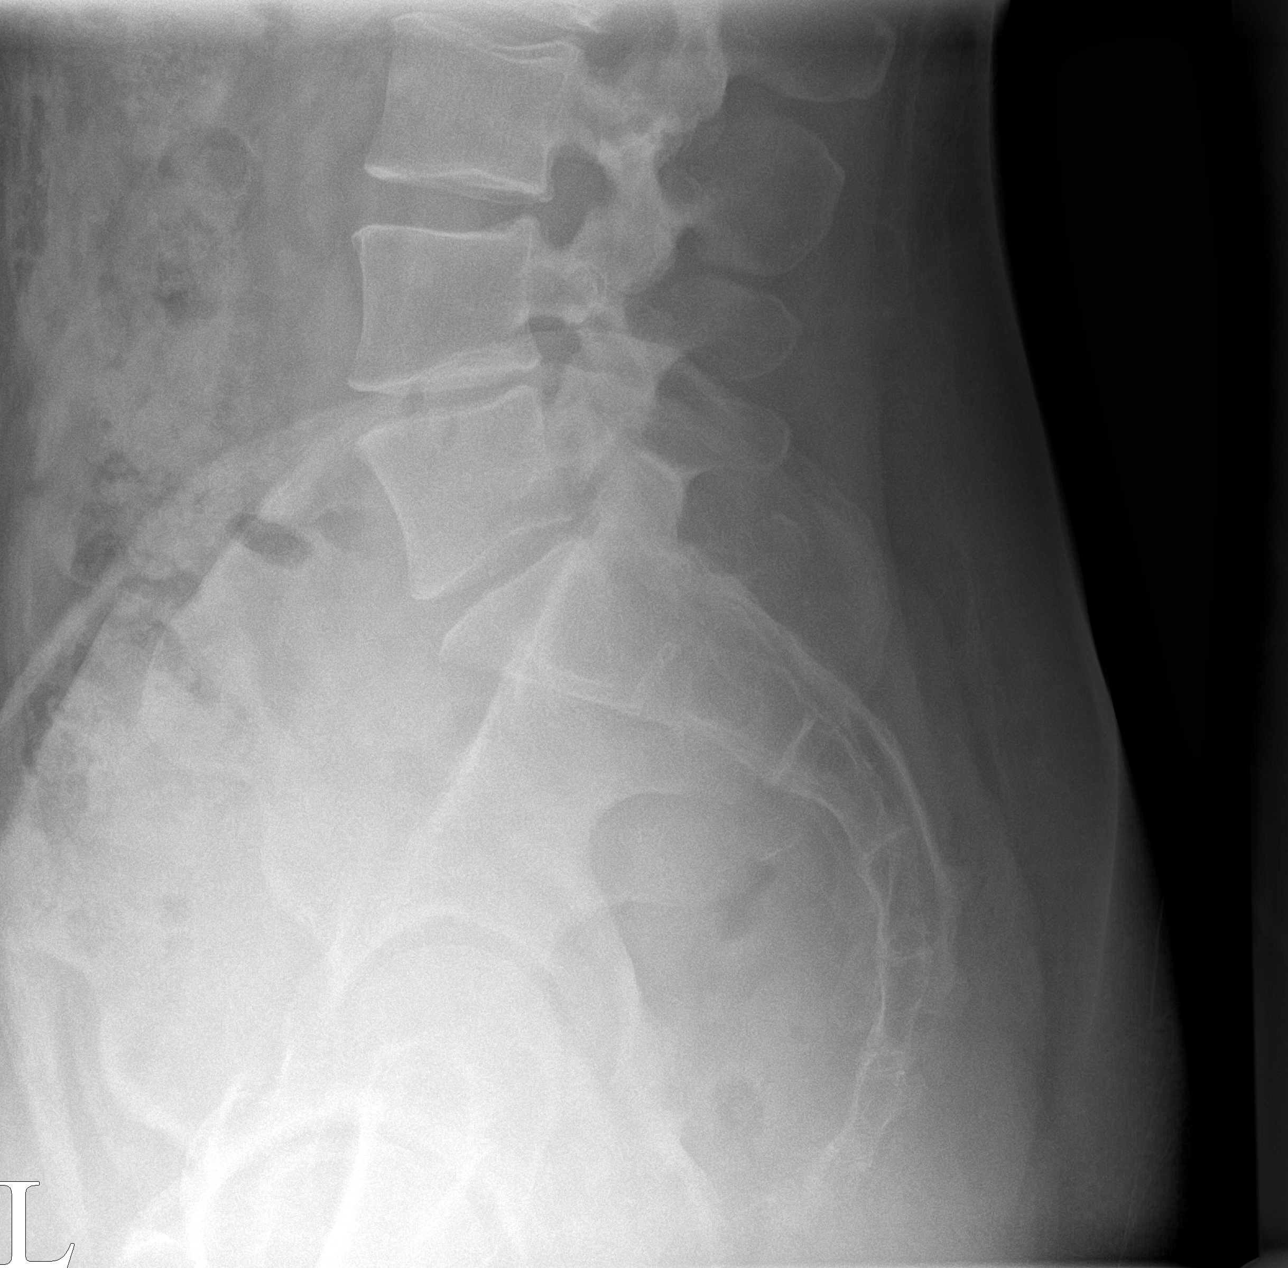

[3 of 3 positions shown; findings below may reference images not displayed]

FINDINGS: No fracture or spondylolisthesis is noted. Moderate degenerative
disc disease is noted at L4-5 and L5-S1.
IMPRESSION: Moderate multilevel degenerative disc disease. No acute abnormality
seen.

## 2020-12-15 MED ORDER — TIZANIDINE HCL 2 MG PO TABS: 2.0000 mg | ORAL_TABLET | Freq: Every day | ORAL | 0 refills | Status: DC

## 2020-12-15 NOTE — Patient Instructions (Addendum)
Xray lumbar Labs test SI joint exercises Zanaflex 2mg  at night when needed See me again in 4-6 weeks and will consider OMT

## 2020-12-15 NOTE — Assessment & Plan Note (Signed)
Patient has 2 histories of the potential for what sounds to be more of 2 episodes of a herniated disc previously.  Now from time to time has more muscle spasms.  Concerned that with the loss of range of motion for symptoms either Sjogren's disease or potentially have calcific changes of the anterior ligamentum flavum.  Discussed with home exercises, icing regimen, which activities to do which wants to avoid.  Patient work with Product/process development scientist today to learn.  I did discuss with patient with a family history of rheumatoid arthritis we will get laboratory work-up to rule out such things as ankylosing spondylitis or rheumatoid arthritis that could be also contributing.  Patient will follow up with me again in 4 to 6 weeks.  Patient is seeing a chiropractor but if he discontinues that can consider the possibility of osteopathic manipulation

## 2020-12-16 ENCOUNTER — Other Ambulatory Visit: Payer: Self-pay

## 2020-12-16 ENCOUNTER — Encounter: Payer: Self-pay | Admitting: Family Medicine

## 2020-12-16 MED ORDER — VITAMIN D (ERGOCALCIFEROL) 1.25 MG (50000 UNIT) PO CAPS: 50000.0000 [IU] | ORAL_CAPSULE | ORAL | 0 refills | Status: DC

## 2020-12-17 LAB — HLA-B27 ANTIGEN: HLA-B27 Antigen: POSITIVE — AB

## 2020-12-17 LAB — CALCIUM, IONIZED: Calcium, Ion: 5.07 mg/dL (ref 4.8–5.6)

## 2020-12-17 LAB — PTH, INTACT AND CALCIUM
Calcium: 9.2 mg/dL (ref 8.6–10.3)
PTH: 45 pg/mL (ref 16–77)

## 2020-12-17 LAB — ANGIOTENSIN CONVERTING ENZYME: Angiotensin-Converting Enzyme: 38 U/L (ref 9–67)

## 2020-12-17 LAB — CYCLIC CITRUL PEPTIDE ANTIBODY, IGG: Cyclic Citrullin Peptide Ab: <16 UNITS

## 2020-12-17 LAB — ANA: Anti Nuclear Antibody (ANA): NEGATIVE

## 2020-12-17 LAB — RHEUMATOID FACTOR: Rheumatoid fact SerPl-aCnc: <14 IU/mL (ref <14)

## 2020-12-18 ENCOUNTER — Encounter: Payer: Self-pay | Admitting: Family Medicine

## 2020-12-22 ENCOUNTER — Other Ambulatory Visit: Payer: Self-pay

## 2020-12-22 DIAGNOSIS — G8929 Other chronic pain: Secondary | ICD-10-CM

## 2020-12-27 ENCOUNTER — Ambulatory Visit
Admission: RE | Admit: 2020-12-27 | Discharge: 2020-12-27 | Disposition: A | Discharge status: Home / Self Care | Payer: Commercial Managed Care - PPO | Source: Ambulatory Visit | Attending: Family Medicine | Admitting: Family Medicine

## 2020-12-27 DIAGNOSIS — G8929 Other chronic pain: Secondary | ICD-10-CM

## 2020-12-27 DIAGNOSIS — M545 Low back pain, unspecified: Secondary | ICD-10-CM

## 2020-12-27 IMAGING — MR MR SACRUM / SI JOINTS WO CM
5 series · 35 of 48 positions shown · non-contrast
Comparison: Lumbar radiographs of [DATE] and lumbar MRI from
[DATE]

CLINICAL DATA: SI joint pain and low back pain

EXAM:
MRI SACRUM WITHOUT CONTRAST
TECHNIQUE: Multiplanar multi-sequence MR imaging of the sacrum was performed.
No intravenous contrast was administered.

[Series 7: T2 fat-sat · axial · 4.0mm · 1.12mm/px · z∈[-175,+93]mm · 9 of 49 slices shown (1 of 2)]
[im 1/49]
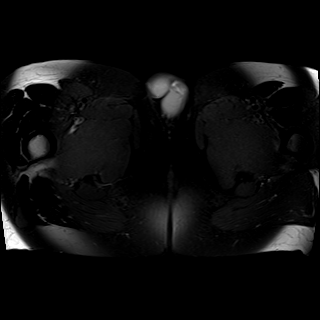
[im 9/49]
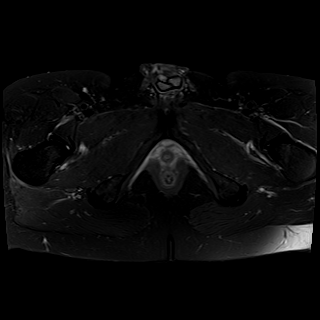
[im 14/49]
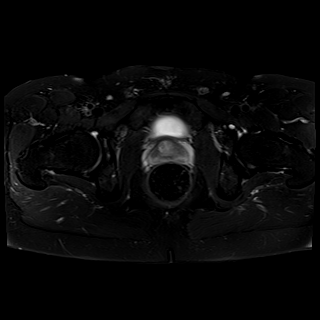
[im 22/49]
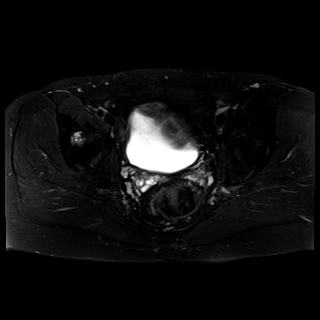
[im 27/49]
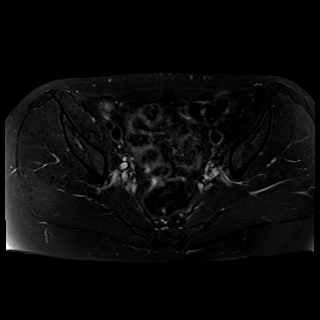
[im 35/49]
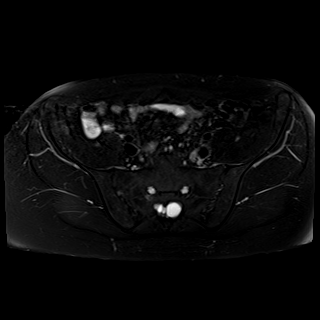
[im 40/49]
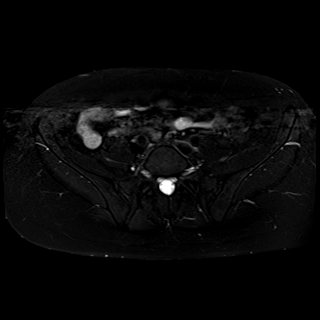
[im 44/49]
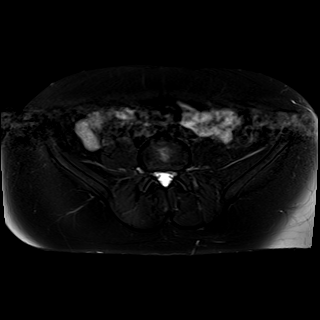
[im 49/49]
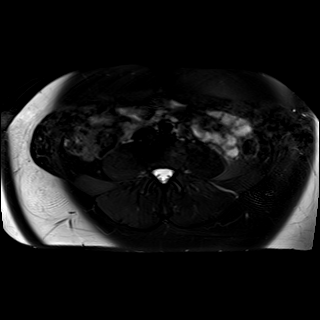

[Series 8: T1 · axial · 4.0mm · 1.12mm/px · z∈[-175,+93]mm · 9 of 49 slices shown (1 of 2)]
[im 1/49]
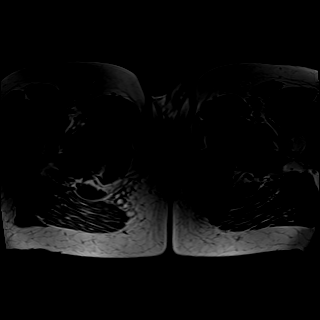
[im 9/49]
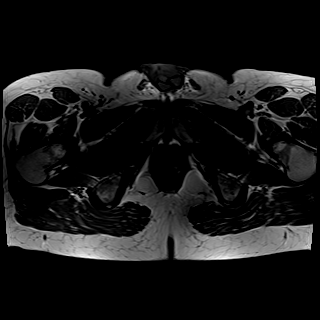
[im 14/49]
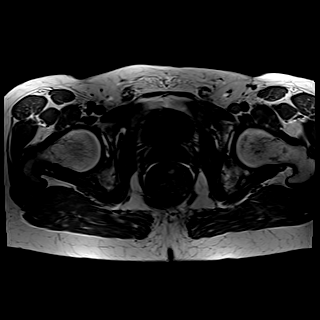
[im 22/49]
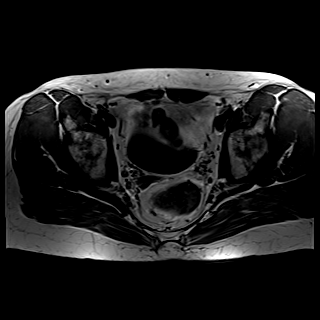
[im 27/49]
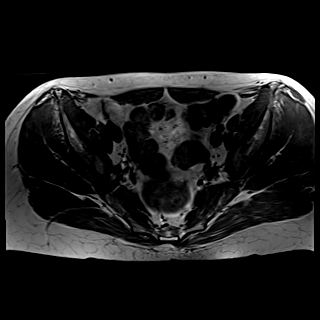
[im 35/49]
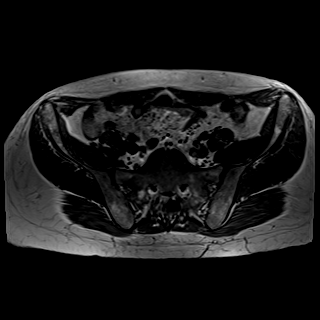
[im 40/49]
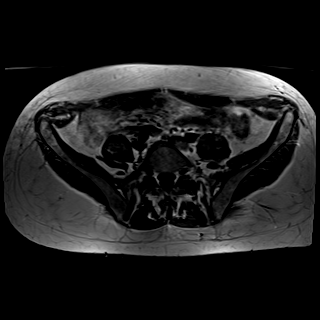
[im 44/49]
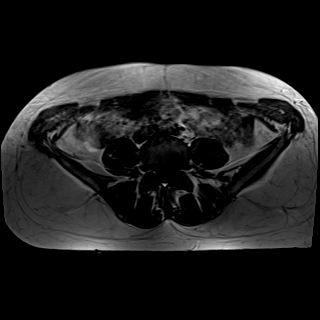
[im 49/49]
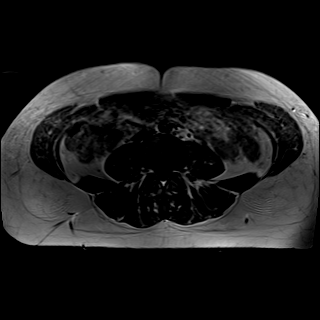

[Series 9: T1 · coronal · 4.0mm · 0.59mm/px · 8 of 33 slices shown (2 of 2)]
[im 1/33]
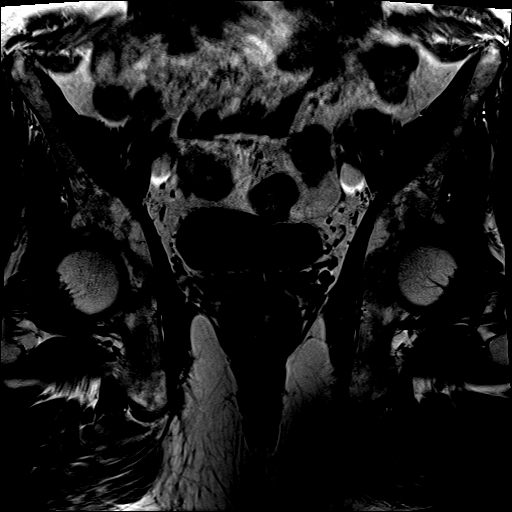
[im 5/33]
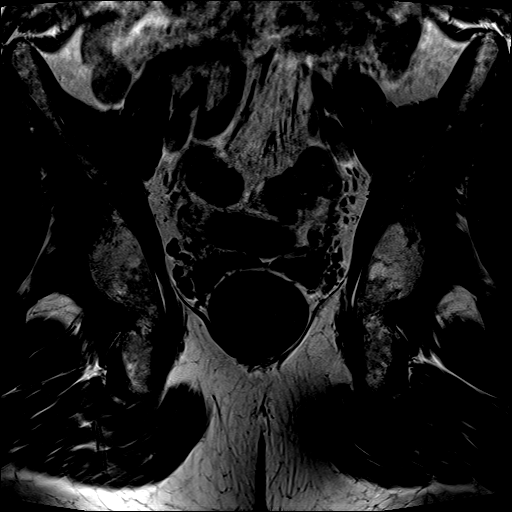
[im 10/33]
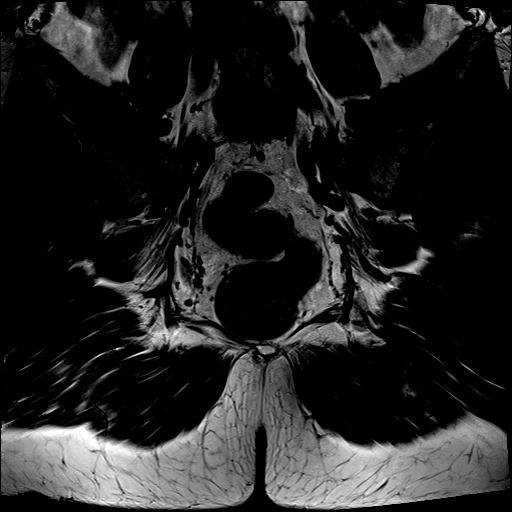
[im 14/33]
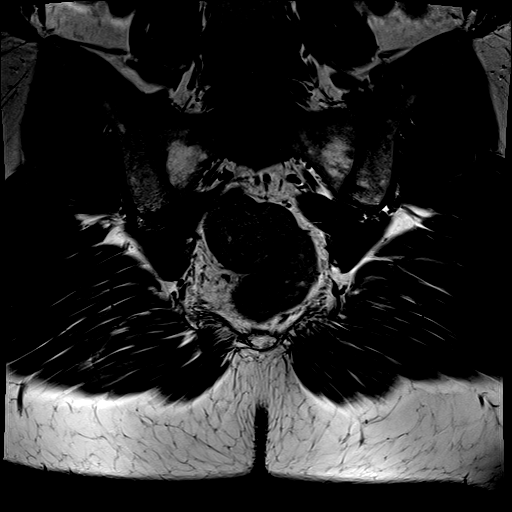
[im 19/33]
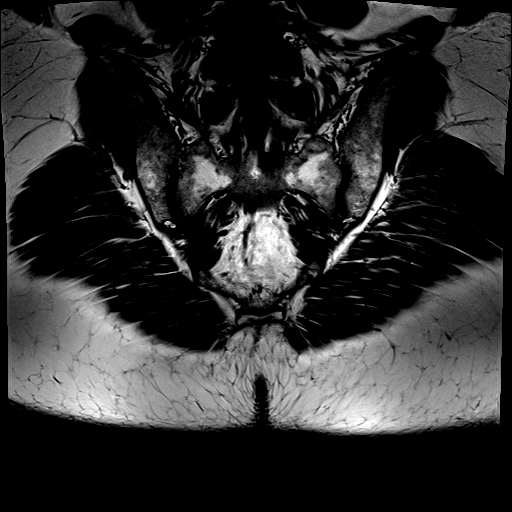
[im 23/33]
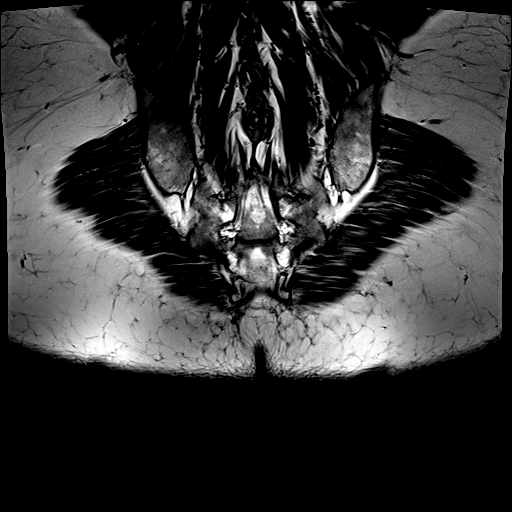
[im 28/33]
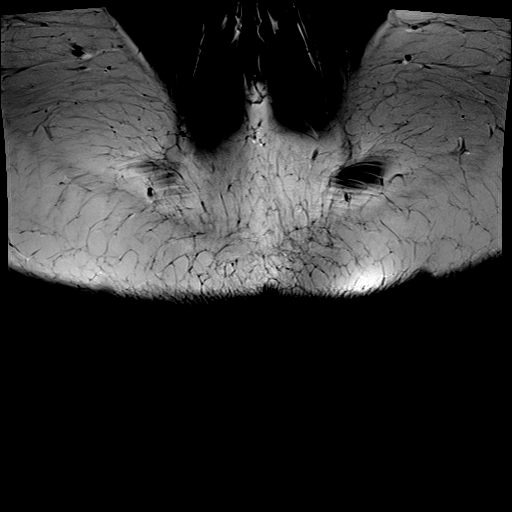
[im 33/33]
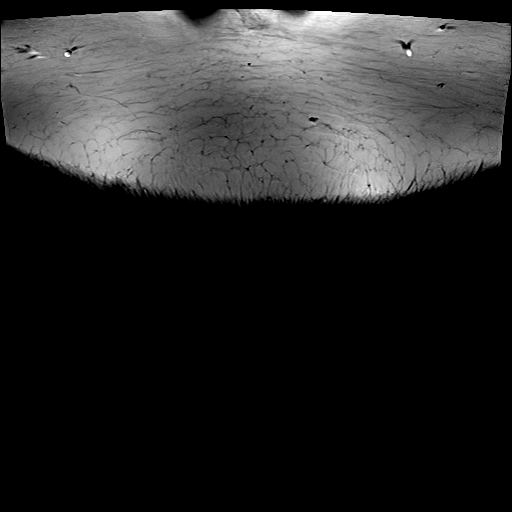

[Series 10: STIR · coronal · 4.0mm · 0.94mm/px · 8 of 33 slices shown]
[im 1/33]
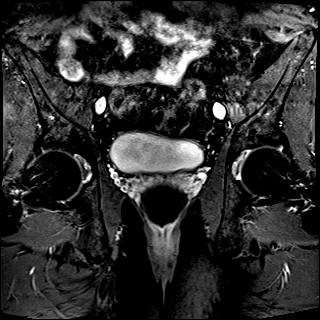
[im 5/33]
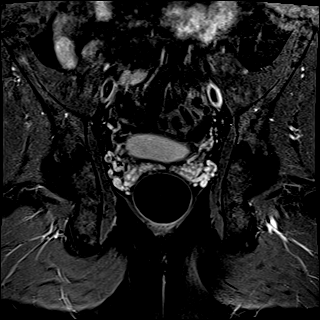
[im 10/33]
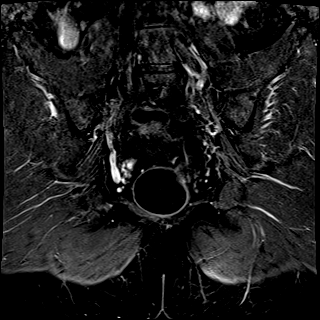
[im 14/33]
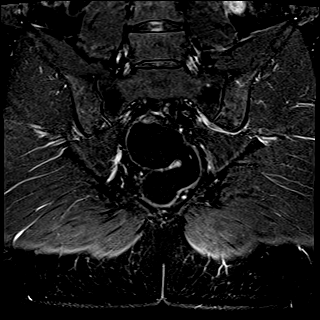
[im 19/33]
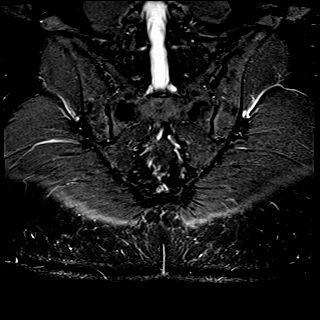
[im 23/33]
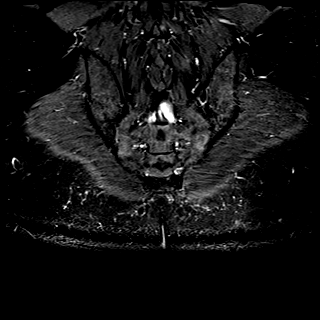
[im 28/33]
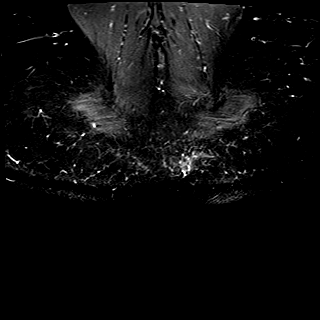
[im 33/33]
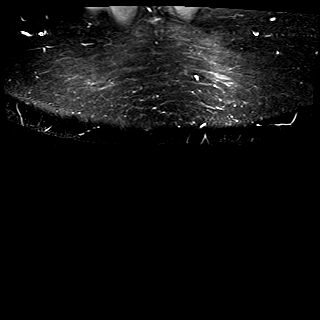

[Series 11: T2 fat-sat · sagittal · 4.0mm · 0.94mm/px · 1 of 31 slices shown (2 of 2)]
[im 1/31]
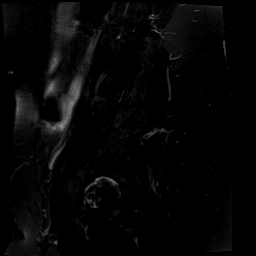

[35 of 48 positions shown; findings below may reference images not displayed]

FINDINGS: Urinary Tract:  Unremarkable

Bowel:  Unremarkable

Vascular/Lymphatic: Unremarkable

Reproductive:  Unremarkable

Other:  No supplemental non-categorized findings.

Musculoskeletal:

Osseous structures: No findings of sacroiliitis or SI joint
effusion. Degenerative subcortical cystic lesion anteriorly along
the right acetabulum. No sacral fracture or acute bony findings in
the sacrum. 1.6 by 1.3 by 1.5 cm left eccentric Tarlov cyst at the
S2 vertebral level. Small synovial herniation pit anteriorly along
the left femoral head, incidental.

Sacral plexus: No abnormal signal or impinging lesion along the
sacral plexus or sciatic notch.

Musculotendinous: Unremarkable
IMPRESSION: 1. 1.6 cm left eccentric Tarlov cyst at the S2 level.
2. No impingement along the sacral plexus or sciatic notch.
3. No findings of sacroiliitis.
4. Please note that today's examination was processed to be
interpreted by [HOSPITAL]. Radiology personnel
were alerted on [DATE] that this exam is in fact be interpreted
by [REDACTED], and formal interpretation is issued on
[DATE].

## 2020-12-27 IMAGING — MR MR LUMBAR SPINE W/O CM
4 of 5 series · 19 of 48 positions shown · non-contrast
Comparison: [DATE] and radiographs from [DATE]

CLINICAL DATA: Low back pain radiating down both sides and into the
legs for 3 years.

EXAM:
MRI LUMBAR SPINE WITHOUT CONTRAST
TECHNIQUE: Multiplanar, multisequence MR imaging of the lumbar spine was
performed. No intravenous contrast was administered.

[Series 7: T2 · sagittal · 4.0mm · 0.88mm/px · 6 of 17 slices shown (1 of 2)]
[im 1/17]
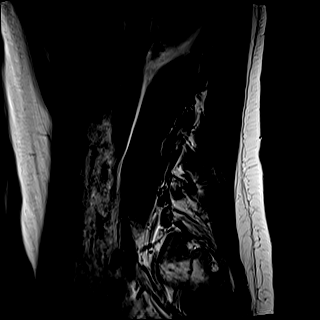
[im 4/17]
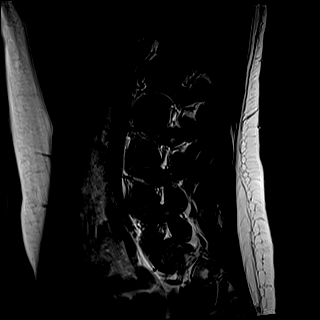
[im 7/17]
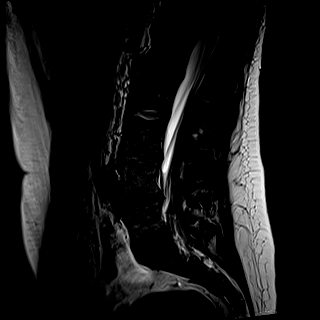
[im 10/17]
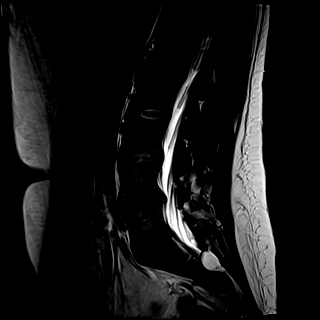
[im 13/17]
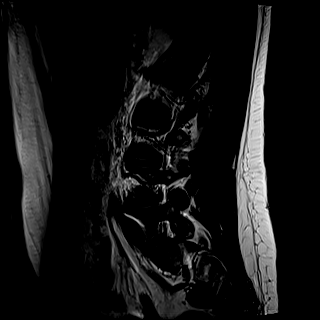
[im 17/17]
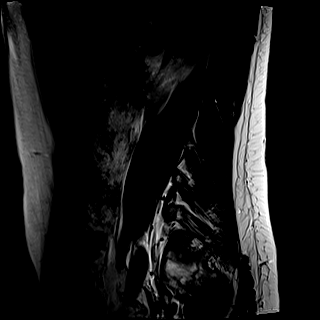

[Series 8: T1 · sagittal · 4.0mm · 0.73mm/px · 3 of 17 slices shown (1 of 2)]
[im 4/17]
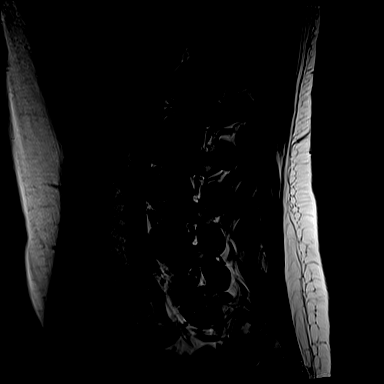
[im 10/17]
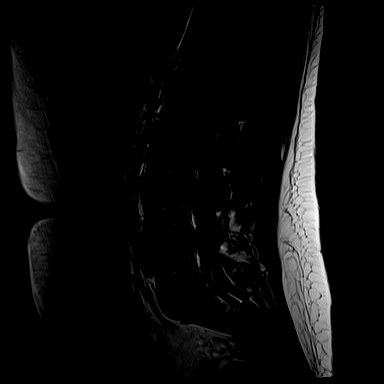
[im 17/17]
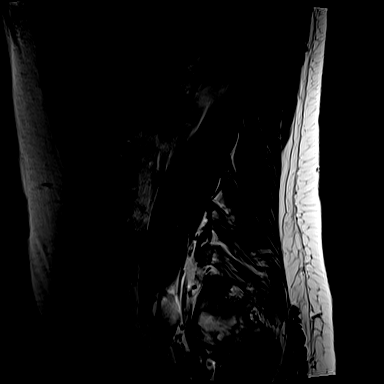

[Series 15: T2 · axial · 4.0mm · 0.28mm/px · z∈[-54,+144]mm · 7 of 46 slices shown (2 of 2)]
[im 1/46]
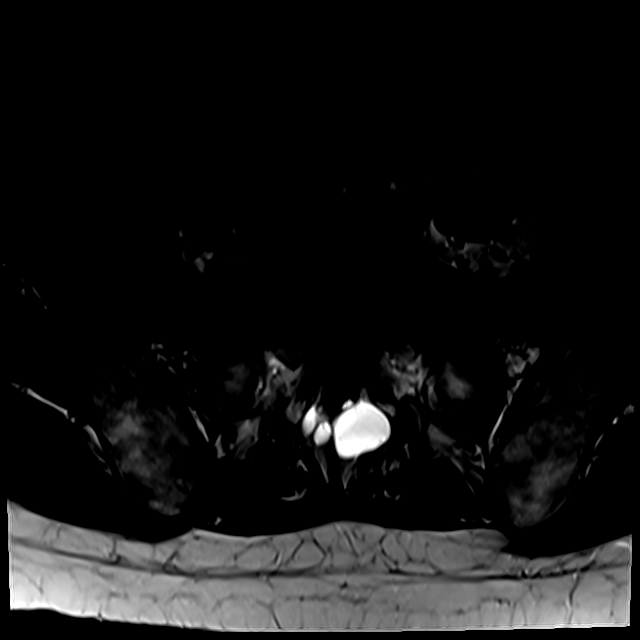
[im 7/46]
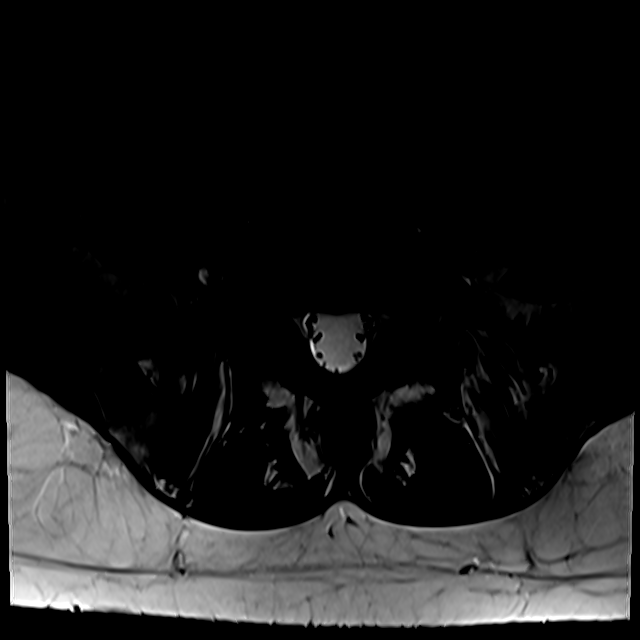
[im 13/46]
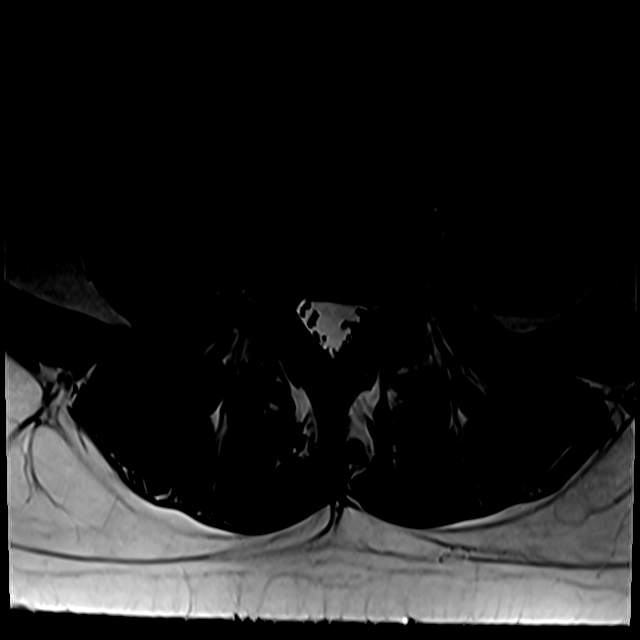
[im 20/46]
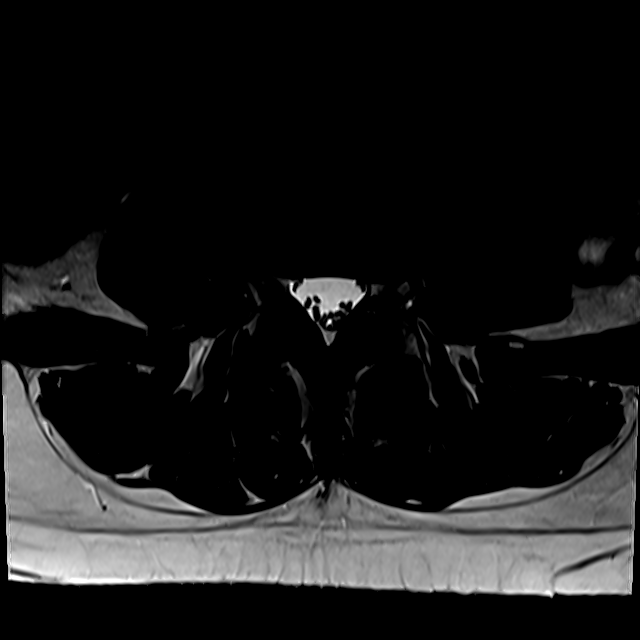
[im 23/46]
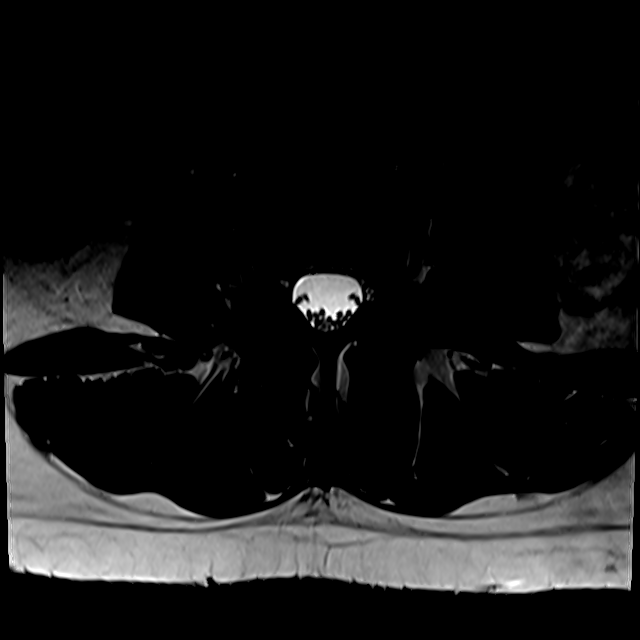
[im 26/46]
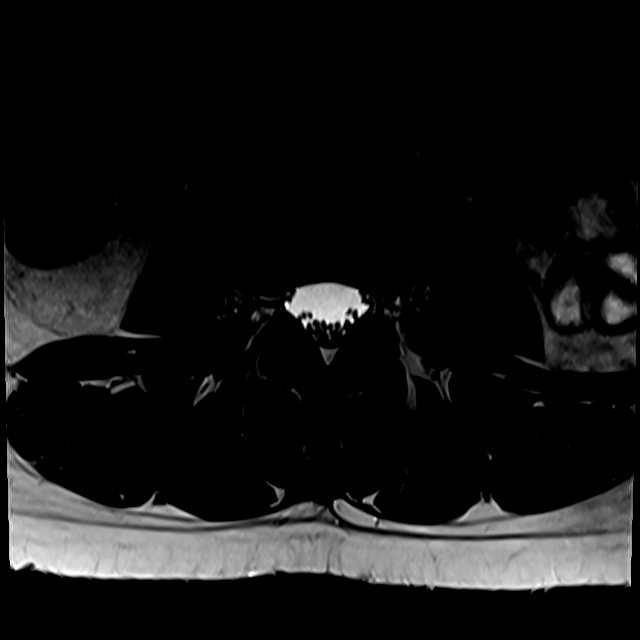
[im 39/46]
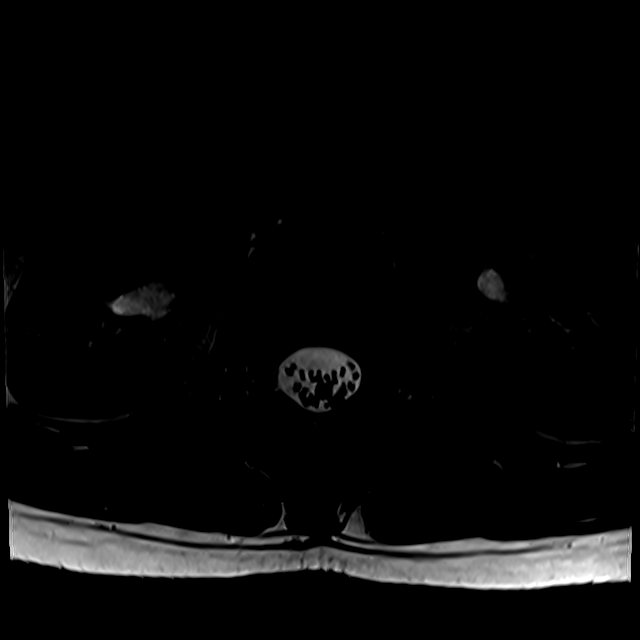

[Series 21: T1 · axial · 4.0mm · 0.28mm/px · z∈[-24,+144]mm · 3 of 46 slices shown (2 of 2)]
[im 7/46]
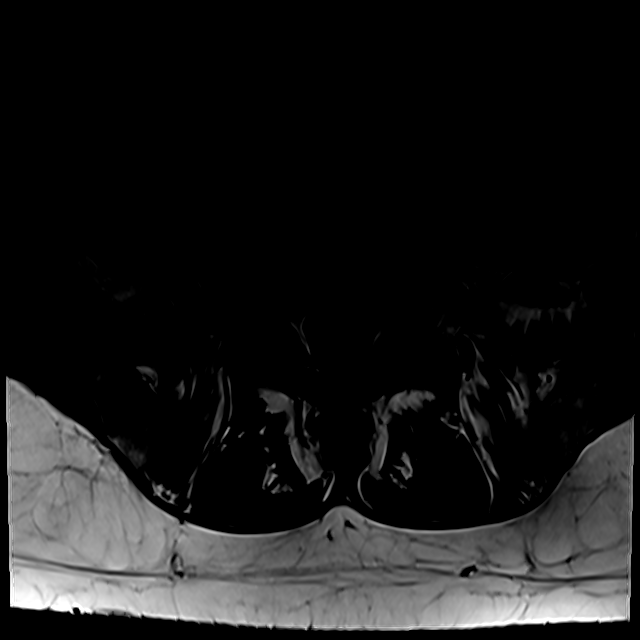
[im 23/46]
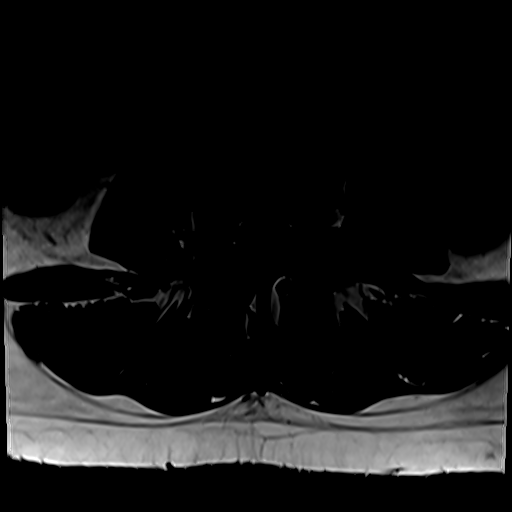
[im 39/46]
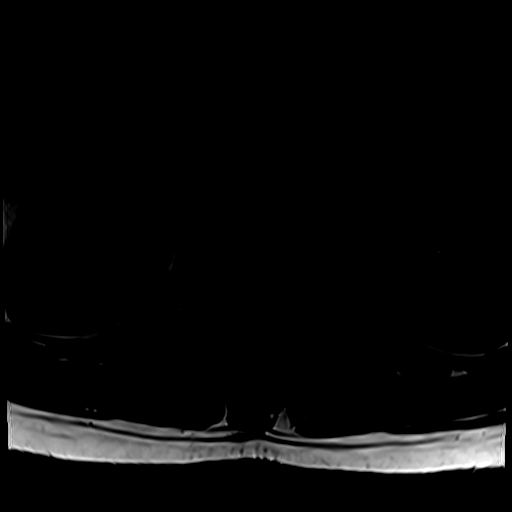

[19 of 48 positions shown; findings below may reference images not displayed]

FINDINGS: Segmentation: The lowest lumbar type non-rib-bearing vertebra is
labeled as L5.

Alignment:  No vertebral subluxation is observed.

Vertebrae:  Mild disc desiccation at L4-5 and L5-S1.

Left eccentric Tarlov cyst at the S2 level as reported on the
dedicated sacral MRI. This is increased by approximately 30% in
volume compared to the [DATE] lumbar MRI.

No significant vertebral marrow edema is identified.

Conus medullaris and cauda equina: Conus extends to the L1 level.
Conus and cauda equina appear normal.

Paraspinal and other soft tissues: Unremarkable

Disc levels:

L1-2: Unremarkable.

L2-3: Unremarkable.

L3-4: No impingement.  Diffuse disc bulge.

L4-5: Mild left lateral recess impingement, minimally worsened from
prior, due to a left lateral recess disc protrusion. Disc bulge
noted. Stable borderline left foraminal stenosis.

L5-S1: No impingement. Mild left greater than right degenerative
facet arthropathy. Small central annular tear.
IMPRESSION: 1. Lower lumbar spondylosis and degenerative disc disease. A left
lateral recess disc protrusion at L4-5 causes mild but slightly
increased impingement on the left L5 nerve roots in the lateral
recess.
2. The left eccentric Tarlov cyst at the S2 level has increased in
volume by about 30% compared to the prior lumbar MRI of [DATE].

Please note that today's examination was processed to be interpreted
by [HOSPITAL]. Radiology personnel were alerted
on [DATE] that this exam is in fact be interpreted by [REDACTED], and formal interpretation is issued on [DATE].

## 2021-01-06 ENCOUNTER — Other Ambulatory Visit: Payer: Self-pay | Admitting: Family Medicine

## 2021-01-21 ENCOUNTER — Encounter: Payer: Self-pay | Admitting: Family Medicine

## 2021-01-21 ENCOUNTER — Other Ambulatory Visit: Payer: Self-pay

## 2021-01-21 ENCOUNTER — Ambulatory Visit (INDEPENDENT_AMBULATORY_CARE_PROVIDER_SITE_OTHER): Payer: Commercial Managed Care - PPO | Admitting: Family Medicine

## 2021-01-21 ENCOUNTER — Other Ambulatory Visit: Payer: Self-pay | Admitting: Family Medicine

## 2021-01-21 DIAGNOSIS — G8929 Other chronic pain: Secondary | ICD-10-CM

## 2021-01-21 DIAGNOSIS — M545 Low back pain, unspecified: Secondary | ICD-10-CM | POA: Diagnosis not present

## 2021-01-21 NOTE — Patient Instructions (Signed)
Dr. Howard Pouch, DO [facility] [place], [place]  [employer] to get in gym. Be careful with running and back extensions See me in 4-6 weeks

## 2021-01-21 NOTE — Progress Notes (Signed)
[place] Phone: [phone] Subjective:   Fontaine No, am serving as a scribe for Dr. Hulan Saas.  This visit occurred during the SARS-CoV-2 public health emergency.  Safety protocols were in place, including screening questions prior to the visit, additional usage of staff PPE, and extensive cleaning of exam room while observing appropriate contact time as indicated for disinfecting solutions.   I'm seeing this patient by the request  of:  Kristen Loader, FNP  CC: Low back pain follow-up  [id number]  12/15/2020 Patient has 2 histories of the potential for what sounds to be more of 2 episodes of a herniated disc previously.  Now from time to time has more muscle spasms.  Concerned that with the loss of range of motion for symptoms either Sjogren's disease or potentially have calcific changes of the anterior ligamentum flavum.  Discussed with home exercises, icing regimen, which activities to do which wants to avoid.  Patient work with Product/process development scientist today to learn.  I did discuss with patient with a family history of rheumatoid arthritis we will get laboratory work-up to rule out such things as ankylosing spondylitis or rheumatoid arthritis that could be also contributing.  Patient will follow up with me again in 4 to 6 weeks.  Patient is seeing a chiropractor but if he discontinues that can consider the possibility of osteopathic manipulation  Update 01/21/2021 Cordelro Gautreau is a 40 y.o. male coming in with complaint of back pain. Patient states that he is doing better but has not been active since last visit. Taking Vit D. Has been working on [employer]. No pain with these exerises.   Laboratory work-up was fairly unremarkable except for significant low vitamin D as well as a positive HLA-B27 but negative ANA  Past Medical History:  Diagnosis Date   Eczema    No past surgical history on file. Social History    Socioeconomic History   Marital status: Married    Spouse name: Not on file   Number of children: Not on file   Years of education: Not on file   Highest education level: Not on file  Occupational History   Not on file  Tobacco Use   Smoking status: Former   Smokeless tobacco: Never  Vaping Use   Vaping Use: Never used  Substance and Sexual Activity   Alcohol use: Yes   Drug use: Never   Sexual activity: Not on file  Other Topics Concern   Not on file  Social History Narrative   ** Merged History Encounter **       Social Determinants of Health   Financial Resource Strain: Not on file  Food Insecurity: Not on file  Transportation Needs: Not on file  Physical Activity: Not on file  Stress: Not on file  Social Connections: Not on file   Allergies  Allergen Reactions   Penicillins Anaphylaxis and Other (See Comments)   Family History  Problem Relation Age of Onset   Allergic rhinitis Mother    Allergic rhinitis Sister    Allergic rhinitis Sister        Current Outpatient Medications (Analgesics):    naproxen sodium (ALEVE) 220 MG tablet, 1-2 tablets with food or milk as needed   Current Outpatient Medications (Other):    ALPRAZolam (XANAX) 1 MG tablet, 1 tablet   tiZANidine (ZANAFLEX) 2 MG tablet, TAKE 1 TABLET BY MOUTH AT BEDTIME.   Vitamin D, Ergocalciferol, (DRISDOL) 1.25  MG (50000 UNIT) CAPS capsule, Take 1 capsule (50,000 Units total) by mouth every 7 (seven) days.   Reviewed prior external information including notes and imaging from  primary care provider As well as notes that were available from care everywhere and other healthcare systems.  Past medical history, social, surgical and family history all reviewed in electronic medical record.  No pertanent information unless stated regarding to the chief complaint.   Review of Systems:  No headache, visual changes, nausea, vomiting, diarrhea, constipation, dizziness, abdominal pain, skin rash,  fevers, chills, night sweats, weight loss, swollen lymph nodes, body aches, joint swelling, chest pain, shortness of breath, mood changes. POSITIVE muscle aches but improving  Objective  Blood pressure 112/76, pulse 70, height _0  (1.854 m), weight 221 lb (100.2 kg), SpO2 98 %.   General: No apparent distress alert and oriented x3 mood and affect normal, dressed appropriately.  HEENT: Pupils equal, extraocular movements intact  Respiratory: Patient's speak in full sentences and does not appear short of breath  Cardiovascular: No lower extremity edema, non tender, no erythema  Gait normal with good balance and coordination.  MSK: Low back pain noted still with some mild loss lordosis.  Sitting comfortably in his chair.    Impression and Recommendations:     The above documentation has been reviewed and is accurate and complete Lyndal Pulley, DO

## 2021-01-21 NOTE — Assessment & Plan Note (Signed)
Patient did have an MRI done. 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  I do believe some facet arthropathy could be contributing as well.  He discussed with patient icing regimen and home exercises.  Patient is able to start to increase activity and patient given some today.  Patient does have Zanaflex for any type of breakthrough.  Patient will see me again in 4 to 6 weeks and will consider the possibility of manipulation.

## 2021-02-24 NOTE — Progress Notes (Deleted)
[place] Phone: [phone] Subjective:    I'm seeing this patient by the request  of:  Kristen Loader, FNP  CC:   [id number]  01/21/2021 Patient did have an MRI done. 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  I do believe some facet arthropathy could be contributing as well.  He discussed with patient icing regimen and home exercises.  Patient is able to start to increase activity and patient given some today.  Patient does have Zanaflex for any type of breakthrough.  Patient will see me again in 4 to 6 weeks and will consider the possibility of manipulation.  Updated 02/24/2021 Tywann Jouett is a 40 y.o. male coming in with complaint of ***  Onset-  Location Duration-  Character- Aggravating factors- Reliving factors-  Therapies tried-  Severity-     Past Medical History:  Diagnosis Date   Eczema    No past surgical history on file. Social History   Socioeconomic History   Marital status: Married    Spouse name: Not on file   Number of children: Not on file   Years of education: Not on file   Highest education level: Not on file  Occupational History   Not on file  Tobacco Use   Smoking status: Former   Smokeless tobacco: Never  Vaping Use   Vaping Use: Never used  Substance and Sexual Activity   Alcohol use: Yes   Drug use: Never   Sexual activity: Not on file  Other Topics Concern   Not on file  Social History Narrative   ** Merged History Encounter **       Social Determinants of Health   Financial Resource Strain: Not on file  Food Insecurity: Not on file  Transportation Needs: Not on file  Physical Activity: Not on file  Stress: Not on file  Social Connections: Not on file   Allergies  Allergen Reactions   Penicillins Anaphylaxis and Other (See Comments)   Family History   Problem Relation Age of Onset   Allergic rhinitis Mother    Allergic rhinitis Sister    Allergic rhinitis Sister        Current Outpatient Medications (Analgesics):    naproxen sodium (ALEVE) 220 MG tablet, 1-2 tablets with food or milk as needed   Current Outpatient Medications (Other):    ALPRAZolam (XANAX) 1 MG tablet, 1 tablet   tiZANidine (ZANAFLEX) 2 MG tablet, TAKE 1 TABLET BY MOUTH EVERYDAY AT BEDTIME   Vitamin D, Ergocalciferol, (DRISDOL) 1.25 MG (50000 UNIT) CAPS capsule, Take 1 capsule (50,000 Units total) by mouth every 7 (seven) days.   Reviewed prior external information including notes and imaging from  primary care provider As well as notes that were available from care everywhere and other healthcare systems.  Past medical history, social, surgical and family history all reviewed in electronic medical record.  No pertanent information unless stated regarding to the chief complaint.   Review of Systems:  No headache, visual changes, nausea, vomiting, diarrhea, constipation, dizziness, abdominal pain, skin rash, fevers, chills, night sweats, weight loss, swollen lymph nodes, body aches, joint swelling, chest pain, shortness of breath, mood changes. POSITIVE muscle aches  Objective  There were no vitals taken for this visit.   General: No apparent distress alert and oriented x3 mood and affect normal, dressed appropriately.  HEENT: Pupils equal, extraocular movements intact  Respiratory: Patient's speak in full sentences and does not appear short of breath  Cardiovascular: No lower extremity edema, non tender, no erythema  Gait normal with good balance and coordination.  MSK:  Non tender with full range of motion and good stability and symmetric strength and tone of shoulders, elbows, wrist, hip, knee and ankles bilaterally.     Impression and Recommendations:     The above documentation has been reviewed and is accurate and complete Belva Agee

## 2021-02-25 ENCOUNTER — Ambulatory Visit: Payer: Commercial Managed Care - PPO | Admitting: Family Medicine

## 2021-02-26 NOTE — Progress Notes (Signed)
William [name] Phone: [phone] Subjective:   William [name], am serving as a scribe for Dr. Hulan [name].  This visit occurred during the SARS-CoV-2 public health emergency.  Safety protocols were in place, including screening questions prior to the visit, additional usage of staff PPE, and extensive cleaning of exam room while observing appropriate contact time as indicated for disinfecting solutions.   I'm seeing this patient by the request  of:  William Loader, FNP  CC: Low back pain follow-up  [id number]  01/21/2021 Patient did have an MRI done. 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  I do believe some facet arthropathy could be contributing as well.  He discussed with patient icing regimen and home exercises.  Patient is able to start to increase activity and patient given some today.  Patient does have Zanaflex for any type of breakthrough.  Patient will see me again in 4 to 6 weeks and will consider the possibility of manipulation.  Updated 03/02/2021 William [name] is a 40 y.o. male coming in with complaint of low back pain. Patient states that his pain continues to improve. Has not had to use Zanaflex much since last visit. Is trying to get back into activity.  Patient has been making progress overall.  Still has some mild discomfort from time to time but [name] quite as severe as what it was previously.  [name] significant radiation.  Seems to still stand Splint does occur.  Patient is just finding it difficult to do the exercises on a regular routine       Past Medical History:  Diagnosis Date   Eczema    [name] past surgical history on file. Social History   Socioeconomic History   Marital status: Married    Spouse name: Not on file   Number of children: Not on file   Years of education: Not on file   Highest education  level: Not on file  Occupational History   Not on file  Tobacco Use   Smoking status: Former   Smokeless tobacco: Never  Vaping Use   Vaping Use: Never used  Substance and Sexual Activity   Alcohol use: Yes   Drug use: Never   Sexual activity: Not on file  Other Topics Concern   Not on file  Social History Narrative   ** Merged History Encounter **       Social Determinants of Health   Financial Resource Strain: Not on file  Food Insecurity: Not on file  Transportation Needs: Not on file  Physical Activity: Not on file  Stress: Not on file  Social Connections: Not on file   Allergies  Allergen Reactions   Penicillins Anaphylaxis and Other (See Comments)   Family History  Problem Relation Age of Onset   Allergic rhinitis Mother    Allergic rhinitis Sister    Allergic rhinitis Sister        Current Outpatient Medications (Analgesics):    naproxen sodium (ALEVE) 220 MG tablet, 1-2 tablets with food or milk as needed   Current Outpatient Medications (Other):    ALPRAZolam (XANAX) 1 MG tablet, 1 tablet   tiZANidine (ZANAFLEX) 2 MG tablet, TAKE 1 TABLET BY MOUTH EVERYDAY AT BEDTIME   Vitamin D, Ergocalciferol, (DRISDOL) 1.25 MG (50000 UNIT) CAPS capsule, Take 1 capsule (50,000 Units total) by mouth every 7 (  seven) days.   Reviewed prior external information including notes and imaging from  primary care provider As well as notes that were available from care everywhere and other healthcare systems.  Past medical history, social, surgical and family history all reviewed in electronic medical record.  [name] pertanent information unless stated regarding to the chief complaint.   Review of Systems:  [name] headache, visual changes, nausea, vomiting, diarrhea, constipation, dizziness, abdominal pain, skin rash, fevers, chills, night sweats, weight loss, swollen lymph nodes, body aches, joint swelling, chest pain, shortness of breath, mood changes. POSITIVE muscle  aches  Objective  Blood pressure 104/74, pulse 82, height '6\' 1"'$  (1.854 m), weight 223 lb (101.2 kg), SpO2 98 %.   General: [name] apparent distress alert and oriented x3 mood and affect normal, dressed appropriately.  HEENT: Pupils equal, extraocular movements intact  Respiratory: Patient's speak in full sentences and does not appear short of breath  Cardiovascular: [name] lower extremity edema, non tender, [name] erythema  Gait normal Patient's low back does have some mild loss of lordosis.  Tightness noted with William [name] left greater than right.  He still has tightness with straight leg test left greater than right.  Neurovascular intact distally.  Osteopathic findings  T7 extended rotated and side bent left L2 flexed rotated and side bent right Sacrum right on right      Impression and Recommendations:     The above documentation has been reviewed and is accurate and complete William Pulley, DO

## 2021-03-02 ENCOUNTER — Other Ambulatory Visit: Payer: Self-pay

## 2021-03-02 ENCOUNTER — Ambulatory Visit (INDEPENDENT_AMBULATORY_CARE_PROVIDER_SITE_OTHER): Payer: Commercial Managed Care - PPO | Admitting: Family Medicine

## 2021-03-02 ENCOUNTER — Encounter: Payer: Self-pay | Admitting: Family Medicine

## 2021-03-02 VITALS — BP 104/74 | HR 82 | Ht 73.0 in | Wt 223.0 lb

## 2021-03-02 DIAGNOSIS — M9904 Segmental and somatic dysfunction of sacral region: Secondary | ICD-10-CM | POA: Diagnosis not present

## 2021-03-02 DIAGNOSIS — M545 Low back pain, unspecified: Secondary | ICD-10-CM

## 2021-03-02 DIAGNOSIS — M9902 Segmental and somatic dysfunction of thoracic region: Secondary | ICD-10-CM

## 2021-03-02 DIAGNOSIS — G8929 Other chronic pain: Secondary | ICD-10-CM

## 2021-03-02 DIAGNOSIS — M9909 Segmental and somatic dysfunction of abdomen and other regions: Secondary | ICD-10-CM | POA: Insufficient documentation

## 2021-03-02 DIAGNOSIS — M9903 Segmental and somatic dysfunction of lumbar region: Secondary | ICD-10-CM

## 2021-03-02 NOTE — Assessment & Plan Note (Signed)
Low back pain seems to be multifactorial.  Has been responding relatively well.  Restarted osteopathic manipulation which I think patient will do relatively well with.  We discussed that patient does have the muscle relaxers if needed.  Discussed avoiding certain activities if necessary.  Follow-up with me again in 6 to 8 weeks

## 2021-03-02 NOTE — Patient Instructions (Signed)
Use Vit D until follow up NO large changes See me again in 6-8 weeks

## 2021-03-02 NOTE — Assessment & Plan Note (Signed)

## 2021-03-04 ENCOUNTER — Other Ambulatory Visit: Payer: Self-pay | Admitting: Family Medicine

## 2021-03-09 MED ORDER — VITAMIN D (ERGOCALCIFEROL) 1.25 MG (50000 UNIT) PO CAPS: 50000.0000 [IU] | ORAL_CAPSULE | ORAL | 0 refills | Status: DC

## 2021-04-21 ENCOUNTER — Ambulatory Visit: Payer: Commercial Managed Care - PPO | Admitting: Family Medicine

## 2021-06-02 NOTE — Progress Notes (Deleted)
°  [place] Phone: [phone] Subjective:    I'm seeing this patient by the request  of:  Kristen Loader, FNP  CC:   [id number]  Ifeanyichukwu Wickham is a 40 y.o. male coming in with complaint of back and neck pain. OMT 03/02/2021. Patient states   Medications patient has been prescribed: Vit D  Taking:         Reviewed prior external information including notes and imaging from previsou exam, outside providers and external EMR if available.   As well as notes that were available from care everywhere and other healthcare systems.  Past medical history, social, surgical and family history all reviewed in electronic medical record.  No pertanent information unless stated regarding to the chief complaint.   Past Medical History:  Diagnosis Date   Eczema     Allergies  Allergen Reactions   Penicillins Anaphylaxis and Other (See Comments)     Review of Systems:  No headache, visual changes, nausea, vomiting, diarrhea, constipation, dizziness, abdominal pain, skin rash, fevers, chills, night sweats, weight loss, swollen lymph nodes, body aches, joint swelling, chest pain, shortness of breath, mood changes. POSITIVE muscle aches  Objective  There were no vitals taken for this visit.   General: No apparent distress alert and oriented x3 mood and affect normal, dressed appropriately.  HEENT: Pupils equal, extraocular movements intact  Respiratory: Patient's speak in full sentences and does not appear short of breath  Cardiovascular: No lower extremity edema, non tender, no erythema  Neuro: Cranial nerves II through XII are intact, neurovascularly intact in all extremities with 2+ DTRs and 2+ pulses.  Gait normal with good balance and coordination.  MSK:  Non tender with full range of motion and good stability and symmetric strength and tone of shoulders, elbows, wrist, hip, knee and ankles bilaterally.  Back  - Normal skin, Spine with normal alignment and no deformity.  No tenderness to vertebral process palpation.  Paraspinous muscles are not tender and without spasm.   Range of motion is full at neck and lumbar sacral regions  Osteopathic findings  C2 flexed rotated and side bent right C6 flexed rotated and side bent left T3 extended rotated and side bent right inhaled rib T9 extended rotated and side bent left L2 flexed rotated and side bent right Sacrum right on right       Assessment and Plan:    Nonallopathic problems  Decision today to treat with OMT was based on Physical Exam  After verbal consent patient was treated with HVLA, ME, FPR techniques in cervical, rib, thoracic, lumbar, and sacral  areas  Patient tolerated the procedure well with improvement in symptoms  Patient given exercises, stretches and lifestyle modifications  See medications in patient instructions if given  Patient will follow up in 4-8 weeks      The above documentation has been reviewed and is accurate and complete Jacqualin Combes       Note: This dictation was prepared with Dragon dictation along with smaller phrase technology. Any transcriptional errors that result from this process are unintentional.

## 2021-06-03 ENCOUNTER — Ambulatory Visit: Payer: Commercial Managed Care - PPO | Admitting: Family Medicine

## 2021-09-21 ENCOUNTER — Ambulatory Visit (INDEPENDENT_AMBULATORY_CARE_PROVIDER_SITE_OTHER): Payer: Commercial Managed Care - PPO | Admitting: Family Medicine

## 2021-09-21 VITALS — BP 110/68 | HR 74 | Ht 73.0 in | Wt 213.0 lb

## 2021-09-21 DIAGNOSIS — M9904 Segmental and somatic dysfunction of sacral region: Secondary | ICD-10-CM

## 2021-09-21 DIAGNOSIS — M9902 Segmental and somatic dysfunction of thoracic region: Secondary | ICD-10-CM

## 2021-09-21 DIAGNOSIS — M9903 Segmental and somatic dysfunction of lumbar region: Secondary | ICD-10-CM | POA: Diagnosis not present

## 2021-09-21 DIAGNOSIS — G8929 Other chronic pain: Secondary | ICD-10-CM

## 2021-09-21 DIAGNOSIS — M545 Low back pain, unspecified: Secondary | ICD-10-CM | POA: Diagnosis not present

## 2021-09-21 DIAGNOSIS — M9901 Segmental and somatic dysfunction of cervical region: Secondary | ICD-10-CM | POA: Diagnosis not present

## 2021-09-21 DIAGNOSIS — M9908 Segmental and somatic dysfunction of rib cage: Secondary | ICD-10-CM

## 2021-09-21 NOTE — Patient Instructions (Signed)
William [name] Chart ?See you again in 2-3 months ?We can recheck vit D then ?

## 2021-09-21 NOTE — Progress Notes (Signed)
?Charlann Boxer D.O. ?[facility] ?[place] ?Phone: [phone] ?Subjective:   ?I, Jacqualin Combes, am serving as a scribe for Dr. Hulan Saas. ? ?This visit occurred during the SARS-CoV-2 public health emergency.  Safety protocols were in place, including screening questions prior to the visit, additional usage of staff PPE, and extensive cleaning of exam room while observing appropriate contact time as indicated for disinfecting solutions.  ? ?I'm seeing this patient by the request  of:  Kristen Loader, FNP ? ?CC: Neck and back pain follow-up ? ?[id number]  ?William [name] is a 41 y.o. male coming in with complaint of back and neck pain. OMT 03/02/2022. Patient states that he has not been having much lower back pain but would like to continue manipulation. 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 These symptoms have thrown off his neck.  ? ?Medications patient has been prescribed:  ? ?Taking: ? ? ?  ? ? ? ? ?Reviewed prior external information including notes and imaging from previsou exam, outside providers and external EMR if available.  ? ?As well as notes that were available from care everywhere and other healthcare systems. ? ?Past medical history, social, surgical and family history all reviewed in electronic medical record.  No pertanent information unless stated regarding to the chief complaint.  ? ?Past Medical History:  ?Diagnosis Date  ? Eczema   ?  ?Allergies  ?Allergen Reactions  ? Penicillins Anaphylaxis and Other (See Comments)  ? ? ? ?Review of Systems: ? No headache, visual changes, nausea, vomiting, diarrhea, constipation, dizziness, abdominal pain, skin rash, fevers, chills, night sweats, weight loss, swollen lymph nodes, body aches, joint swelling, chest pain, shortness of breath, mood changes. POSITIVE muscle aches ? ?Objective  ?Blood pressure 110/68, pulse 74, height '6\' 1"'$  (1.854 m), weight 213 lb (96.6 kg). ?   ?General: No apparent distress alert and oriented x3 mood and affect normal, dressed appropriately.  ?HEENT: Pupils equal, extraocular movements intact  ?Respiratory: Patient's speak in full sentences and does not appear short of breath  ?Cardiovascular: No lower extremity edema, non tender, no erythema  ?Neck exam does have some very mild loss of lordosis.  Low back exam does have some tenderness noted as well.  Tightness noted with sidebending bilaterally. ? ?Osteopathic findings ? ?C2 flexed rotated and side bent right ?C7 flexed rotated and side bent left ?T3 extended rotated and side bent right inhaled rib ?T6 extended rotated and side bent left ?L2 flexed rotated and side bent right ?Sacrum right on right ? ? ? ? ?  ?Assessment and Plan: ? ?Low back pain ?Patient has had difficulty with the left side previously.  Still responding well to osteopathic manipulation.  Not using any significant medications.  Can repeat osteopathic manipulation in 3 months if needed.  ? ?Nonallopathic problems ? ?Decision today to treat with OMT was based on Physical Exam ? ?After verbal consent patient was treated with HVLA, ME, FPR techniques in cervical, rib, thoracic, lumbar, and sacral  areas ? ?Patient tolerated the procedure well with improvement in symptoms ? ?Patient given exercises, stretches and lifestyle modifications ? ?See medications in patient instructions if given ? ?Patient will follow up in 4-8 weeks ? ?  ? ? ?The above documentation has been reviewed and is accurate and complete Lyndal Pulley, DO ? ? ? ?  ? ? Note: This dictation was prepared with Dragon dictation along with smaller  Company secretary. Any transcriptional errors that result from this process are unintentional.    ?  ?  ? ?

## 2021-09-21 NOTE — Assessment & Plan Note (Signed)
Patient has had difficulty with the left side previously.  Still responding well to osteopathic manipulation.  Not using any significant medications.  Can repeat osteopathic manipulation in 3 months if needed. ?

## 2021-10-05 ENCOUNTER — Other Ambulatory Visit: Payer: Self-pay | Admitting: Family Medicine

## 2021-10-06 MED ORDER — VITAMIN D (ERGOCALCIFEROL) 1.25 MG (50000 UNIT) PO CAPS: 50000.0000 [IU] | ORAL_CAPSULE | ORAL | 0 refills | Status: DC

## 2021-10-08 LAB — LAB REPORT - SCANNED
A1c: 5.2
EGFR (Non-African Amer.): 114

## 2021-11-20 DIAGNOSIS — M5430 Sciatica, unspecified side: Secondary | ICD-10-CM | POA: Insufficient documentation

## 2021-11-20 DIAGNOSIS — F419 Anxiety disorder, unspecified: Secondary | ICD-10-CM | POA: Insufficient documentation

## 2021-11-25 NOTE — Progress Notes (Unsigned)
  [place] Phone: [phone] Subjective:    I'm seeing this patient by the request  of:  Kristen Loader, FNP  CC:   [id number]  Kijana Cromie is a 41 y.o. male coming in with complaint of back and neck pain. OMT 09/21/2021. Patient states   Medications patient has been prescribed: Vit D  Taking:         Reviewed prior external information including notes and imaging from previsou exam, outside providers and external EMR if available.   As well as notes that were available from care everywhere and other healthcare systems.  Past medical history, social, surgical and family history all reviewed in electronic medical record.  No pertanent information unless stated regarding to the chief complaint.   Past Medical History:  Diagnosis Date   Eczema     Allergies  Allergen Reactions   Penicillins Anaphylaxis and Other (See Comments)     Review of Systems:  No headache, visual changes, nausea, vomiting, diarrhea, constipation, dizziness, abdominal pain, skin rash, fevers, chills, night sweats, weight loss, swollen lymph nodes, body aches, joint swelling, chest pain, shortness of breath, mood changes. POSITIVE muscle aches  Objective  There were no vitals taken for this visit.   General: No apparent distress alert and oriented x3 mood and affect normal, dressed appropriately.  HEENT: Pupils equal, extraocular movements intact  Respiratory: Patient's speak in full sentences and does not appear short of breath  Cardiovascular: No lower extremity edema, non tender, no erythema  Neuro: Cranial nerves II through XII are intact, neurovascularly intact in all extremities with 2+ DTRs and 2+ pulses.  Gait normal with good balance and coordination.  MSK:  Non tender with full range of motion and good stability and symmetric strength and tone of shoulders, elbows, wrist, hip, knee and ankles bilaterally.  Back  - Normal skin, Spine with normal alignment and no deformity.  No tenderness to vertebral process palpation.  Paraspinous muscles are not tender and without spasm.   Range of motion is full at neck and lumbar sacral regions  Osteopathic findings  C2 flexed rotated and side bent right C6 flexed rotated and side bent left T3 extended rotated and side bent right inhaled rib T9 extended rotated and side bent left L2 flexed rotated and side bent right Sacrum right on right       Assessment and Plan:    Nonallopathic problems  Decision today to treat with OMT was based on Physical Exam  After verbal consent patient was treated with HVLA, ME, FPR techniques in cervical, rib, thoracic, lumbar, and sacral  areas  Patient tolerated the procedure well with improvement in symptoms  Patient given exercises, stretches and lifestyle modifications  See medications in patient instructions if given  Patient will follow up in 4-8 weeks      The above documentation has been reviewed and is accurate and complete Jacqualin Combes       Note: This dictation was prepared with Dragon dictation along with smaller phrase technology. Any transcriptional errors that result from this process are unintentional.

## 2021-11-30 ENCOUNTER — Ambulatory Visit (INDEPENDENT_AMBULATORY_CARE_PROVIDER_SITE_OTHER): Payer: Commercial Managed Care - PPO | Admitting: Family Medicine

## 2021-11-30 ENCOUNTER — Encounter: Payer: Self-pay | Admitting: Family Medicine

## 2021-11-30 VITALS — BP 102/80 | HR 66 | Ht 73.0 in | Wt 212.0 lb

## 2021-11-30 DIAGNOSIS — G8929 Other chronic pain: Secondary | ICD-10-CM

## 2021-11-30 DIAGNOSIS — M9908 Segmental and somatic dysfunction of rib cage: Secondary | ICD-10-CM

## 2021-11-30 DIAGNOSIS — M9904 Segmental and somatic dysfunction of sacral region: Secondary | ICD-10-CM | POA: Diagnosis not present

## 2021-11-30 DIAGNOSIS — M9903 Segmental and somatic dysfunction of lumbar region: Secondary | ICD-10-CM

## 2021-11-30 DIAGNOSIS — M9901 Segmental and somatic dysfunction of cervical region: Secondary | ICD-10-CM

## 2021-11-30 DIAGNOSIS — M9902 Segmental and somatic dysfunction of thoracic region: Secondary | ICD-10-CM

## 2021-11-30 DIAGNOSIS — M545 Low back pain, unspecified: Secondary | ICD-10-CM | POA: Diagnosis not present

## 2021-11-30 NOTE — Patient Instructions (Signed)
Change to daily Vit D of 2000IU  Try to do exercises See me again in 2-3 months

## 2021-11-30 NOTE — Assessment & Plan Note (Signed)
Chronic problem, but stable. Discussed HEP  Discussed working position.  Discussed taking breaks on driving  RTC in 5-48 weeks

## 2022-02-03 NOTE — Progress Notes (Signed)
William [name] [address] [place] Phone: [phone] Subjective:   William [name], am serving as a scribe for Dr. Hulan Saas.  I'm seeing this patient by the request  of:  Kristen Loader, FNP  CC: back and neck pain   [id number]  William [name] is a 41 y.o. male coming in with complaint of back and neck pain. OMT 11/30/2021. Patient states doing well. After last manipulation had some pain in neck and right shoulder that persisted for a while. No new issues.  Medications patient has been prescribed: Vit D  Taking:         Reviewed prior external information including notes and imaging from previsou exam, outside providers and external EMR if available.   As well as notes that were available from care everywhere and other healthcare systems.  Past medical history, social, surgical and family history all reviewed in electronic medical record.  No pertanent information unless stated regarding to the chief complaint.   Past Medical History:  Diagnosis Date   Eczema     Allergies  Allergen Reactions   Penicillins Anaphylaxis and Other (See Comments)     Review of Systems:  No headache, visual changes, nausea, vomiting, diarrhea, constipation, dizziness, abdominal pain, skin rash, fevers, chills, night sweats, weight loss, swollen lymph nodes, body aches, joint swelling, chest pain, shortness of breath, mood changes. POSITIVE muscle aches  Objective  Blood pressure 120/64, pulse 75, height '6\' 1"'$  (1.854 m), weight 223 lb (101.2 kg), SpO2 97 %.   General: No apparent distress alert and oriented x3 mood and affect normal, dressed appropriately.  HEENT: Pupils equal, extraocular movements intact  Respiratory: Patient's speak in full sentences and does not appear short of breath  Cardiovascular: No lower extremity edema, non tender, no erythema  Gait relatively normal MSK:  Back does have some mild loss of lordosis of the  lumbar spine.  Tightness in the thoracolumbar juncture right greater than left.  Patient does have tightness noted of the left side of the back at the iliac joint.  Osteopathic findings  C2 flexed rotated and side bent right C6 flexed rotated and side bent left T3 extended rotated and side bent right inhaled rib T9 extended rotated and side bent left L2 flexed rotated and side bent right Sacrum left on left    Assessment and Plan:  Low back pain We will continue to respond relatively well to osteopathic manipulation.  Did have some more tightness noted of the neck actually today than the lower back again.  We will continue to monitor that.  Patient is not having any radicular symptoms so do not feel that further imaging would be warranted at the moment or change medical management.  Patient will increase activity slowly otherwise.  Follow-up again in 6 to 8 weeks.    Nonallopathic problems  Decision today to treat with OMT was based on Physical Exam  After verbal consent patient was treated with HVLA, ME, FPR techniques in cervical, rib, thoracic, lumbar, and sacral  areas  Patient tolerated the procedure well with improvement in symptoms  Patient given exercises, stretches and lifestyle modifications  See medications in patient instructions if given  Patient will follow up in 4-8 weeks    The above documentation has been reviewed and is accurate and complete William Pulley, DO          Note: This dictation was prepared with Dragon dictation along with smaller phrase technology.  Any transcriptional errors that result from this process are unintentional.

## 2022-02-08 ENCOUNTER — Encounter: Payer: Self-pay | Admitting: Family Medicine

## 2022-02-08 ENCOUNTER — Ambulatory Visit (INDEPENDENT_AMBULATORY_CARE_PROVIDER_SITE_OTHER): Payer: Commercial Managed Care - PPO | Admitting: Family Medicine

## 2022-02-08 VITALS — BP 120/64 | HR 75 | Ht 73.0 in | Wt 223.0 lb

## 2022-02-08 DIAGNOSIS — M9904 Segmental and somatic dysfunction of sacral region: Secondary | ICD-10-CM

## 2022-02-08 DIAGNOSIS — M9903 Segmental and somatic dysfunction of lumbar region: Secondary | ICD-10-CM | POA: Diagnosis not present

## 2022-02-08 DIAGNOSIS — M9902 Segmental and somatic dysfunction of thoracic region: Secondary | ICD-10-CM | POA: Diagnosis not present

## 2022-02-08 DIAGNOSIS — M9901 Segmental and somatic dysfunction of cervical region: Secondary | ICD-10-CM

## 2022-02-08 DIAGNOSIS — M9908 Segmental and somatic dysfunction of rib cage: Secondary | ICD-10-CM | POA: Diagnosis not present

## 2022-02-08 DIAGNOSIS — M545 Low back pain, unspecified: Secondary | ICD-10-CM

## 2022-02-08 DIAGNOSIS — G8929 Other chronic pain: Secondary | ICD-10-CM

## 2022-02-08 NOTE — Assessment & Plan Note (Signed)
We will continue to respond relatively well to osteopathic manipulation.  Did have some more tightness noted of the neck actually today than the lower back again.  We will continue to monitor that.  Patient is not having any radicular symptoms so do not feel that further imaging would be warranted at the moment or change medical management.  Patient will increase activity slowly otherwise.  Follow-up again in 6 to 8 weeks.

## 2022-02-08 NOTE — Patient Instructions (Signed)
Do prescribed exercises at least 3x a week Otherwise keep doing everything else

## 2022-03-17 NOTE — Progress Notes (Deleted)
  [place] Phone: [phone] Subjective:    I'm seeing this patient by the request  of:  Kristen Loader, FNP  CC:   [id number]  William [name] is a 41 y.o. male coming in with complaint of back and neck pain. OMT 02/08/2022 Patient states   Medications patient has been prescribed: None  Taking:         Reviewed prior external information including notes and imaging from previsou exam, outside providers and external EMR if available.   As well as notes that were available from care everywhere and other healthcare systems.  Past medical history, social, surgical and family history all reviewed in electronic medical record.  No pertanent information unless stated regarding to the chief complaint.   Past Medical History:  Diagnosis Date   Eczema     Allergies  Allergen Reactions   Penicillins Anaphylaxis and Other (See Comments)     Review of Systems:  No headache, visual changes, nausea, vomiting, diarrhea, constipation, dizziness, abdominal pain, skin rash, fevers, chills, night sweats, weight loss, swollen lymph nodes, body aches, joint swelling, chest pain, shortness of breath, mood changes. POSITIVE muscle aches  Objective  There were no vitals taken for this visit.   General: No apparent distress alert and oriented x3 mood and affect normal, dressed appropriately.  HEENT: Pupils equal, extraocular movements intact  Respiratory: Patient's speak in full sentences and does not appear short of breath  Cardiovascular: No lower extremity edema, non tender, no erythema  Gait MSK:  Back   Osteopathic findings  C2 flexed rotated and side bent right C6 flexed rotated and side bent left T3 extended rotated and side bent right inhaled rib T9 extended rotated and side bent left L2 flexed rotated and side bent right Sacrum right on right       Assessment and Plan:  No problem-specific  Assessment & Plan notes found for this encounter.    Nonallopathic problems  Decision today to treat with OMT was based on Physical Exam  After verbal consent patient was treated with HVLA, ME, FPR techniques in cervical, rib, thoracic, lumbar, and sacral  areas  Patient tolerated the procedure well with improvement in symptoms  Patient given exercises, stretches and lifestyle modifications  See medications in patient instructions if given  Patient will follow up in 4-8 weeks     The above documentation has been reviewed and is accurate and complete Lyndal Pulley, DO         Note: This dictation was prepared with Dragon dictation along with smaller phrase technology. Any transcriptional errors that result from this process are unintentional.

## 2022-03-22 ENCOUNTER — Ambulatory Visit: Payer: Commercial Managed Care - PPO | Admitting: Family Medicine

## 2022-03-30 DIAGNOSIS — H4912 Fourth [trochlear] nerve palsy, left eye: Secondary | ICD-10-CM | POA: Insufficient documentation

## 2022-03-30 DIAGNOSIS — H5202 Hypermetropia, left eye: Secondary | ICD-10-CM | POA: Insufficient documentation

## 2022-04-05 NOTE — Progress Notes (Unsigned)
William [name] [address] [place] Phone: [phone] Subjective:   William [name], am serving as a scribe for Dr. Hulan Saas.  I'm seeing this patient by the request  of:  Kristen Loader, FNP  CC: Low back pain follow-up  [id number]  William [name] is a 41 y.o. male coming in with complaint of back and neck pain. OMT on 02/08/2022. Patient states doing well since last visit. No new issues.  Patient still very aware of the back on a regular basis.  Does not have to take medicine regularly and not stopping him from his regular daily activities.  Medications patient has been prescribed: None  Taking:         Reviewed prior external information including notes and imaging from previsou exam, outside providers and external EMR if available.   As well as notes that were available from care everywhere and other healthcare systems.  Past medical history, social, surgical and family history all reviewed in electronic medical record.  No pertanent information unless stated regarding to the chief complaint.   Past Medical History:  Diagnosis Date   Eczema     Allergies  Allergen Reactions   Penicillins Anaphylaxis and Other (See Comments)     Review of Systems:  No headache, visual changes, nausea, vomiting, diarrhea, constipation, dizziness, abdominal pain, skin rash, fevers, chills, night sweats, weight loss, swollen lymph nodes, body aches, joint swelling, chest pain, shortness of breath, mood changes. POSITIVE muscle aches  Objective  Blood pressure 120/68, pulse 76, height '6\' 1"'$  (1.854 m), weight 220 lb (99.8 kg), SpO2 98 %.   General: No apparent distress alert and oriented x3 mood and affect normal, dressed appropriately.  HEENT: Pupils equal, extraocular movements intact  Respiratory: Patient's speak in full sentences and does not appear short of breath  Cardiovascular: No lower extremity edema, non tender, no erythema   Gait MSK:  Back low back still has tenderness on the left side more than anything else.  Patient does have some mild tightness with FABER test bilaterally.  Seems to be right greater than left.  Osteopathic findings  C5 flexed rotated and side bent left T3 extended rotated and side bent right inhaled rib T9 extended rotated and side bent left L2 flexed rotated and side bent right' \\L4'$  flexed rotated and side bent left Sacrum right on right       Assessment and Plan:  Low back pain Low back pain does still have pain on the left side mostly in the paraspinal musculature of the lumbar spine.  Patient does have a disc protrusion at the L4-L5 area possibly pushing on the L5 nerve root.  Still can be contributing to some of the discomfort and pain.  We discussed the possibility of injections if needed still.  Discussed the possibility of the muscle relaxer of 2 mg at night but has not been doing it as much.  Is responding well to the manipulation.  Follow-up again in 6 to 8 weeks for further evaluation.    Nonallopathic problems  Decision today to treat with OMT was based on Physical Exam  After verbal consent patient was treated with HVLA, ME, FPR techniques in cervical, rib, thoracic, lumbar, and sacral  areas  Patient tolerated the procedure well with improvement in symptoms  Patient given exercises, stretches and lifestyle modifications  See medications in patient instructions if given  Patient will follow up in 4-8 weeks  Note: This dictation was prepared with Dragon dictation along with smaller phrase technology. Any transcriptional errors that result from this process are unintentional.

## 2022-04-07 ENCOUNTER — Ambulatory Visit (INDEPENDENT_AMBULATORY_CARE_PROVIDER_SITE_OTHER): Payer: Commercial Managed Care - PPO | Admitting: Family Medicine

## 2022-04-07 VITALS — BP 120/68 | HR 76 | Ht 73.0 in | Wt 220.0 lb

## 2022-04-07 DIAGNOSIS — M9904 Segmental and somatic dysfunction of sacral region: Secondary | ICD-10-CM

## 2022-04-07 DIAGNOSIS — M9908 Segmental and somatic dysfunction of rib cage: Secondary | ICD-10-CM | POA: Diagnosis not present

## 2022-04-07 DIAGNOSIS — M9903 Segmental and somatic dysfunction of lumbar region: Secondary | ICD-10-CM

## 2022-04-07 DIAGNOSIS — M545 Low back pain, unspecified: Secondary | ICD-10-CM

## 2022-04-07 DIAGNOSIS — M9901 Segmental and somatic dysfunction of cervical region: Secondary | ICD-10-CM | POA: Diagnosis not present

## 2022-04-07 DIAGNOSIS — M9902 Segmental and somatic dysfunction of thoracic region: Secondary | ICD-10-CM

## 2022-04-07 DIAGNOSIS — G8929 Other chronic pain: Secondary | ICD-10-CM

## 2022-04-07 NOTE — Patient Instructions (Signed)
Good to see you! Have fun with Clint Lipps

## 2022-04-07 NOTE — Assessment & Plan Note (Signed)
Low back pain does still have pain on the left side mostly in the paraspinal musculature of the lumbar spine.  Patient does have a disc protrusion at the L4-L5 area possibly pushing on the L5 nerve root.  Still can be contributing to some of the discomfort and pain.  We discussed the possibility of injections if needed still.  Discussed the possibility of the muscle relaxer of 2 mg at night but has not been doing it as much.  Is responding well to the manipulation.  Follow-up again in 6 to 8 weeks for further evaluation.

## 2022-05-31 ENCOUNTER — Encounter: Payer: Self-pay | Admitting: Family Medicine

## 2022-06-08 NOTE — Progress Notes (Signed)
  [place] Phone: [phone] Subjective:   William [name], am serving as a scribe for Dr. Hulan [name].  I'm seeing this patient by the request  of:  William Loader, FNP  TY:OMAY and neck pain   [id number]  William [name] is a 41 y.o. male coming in with complaint of back and neck pain. OMT 04/07/2022. Patient states that he is doing fine. Having typically pain noted   Medications patient has been prescribed: Vit D  Taking:yes          Reviewed prior external information including notes and imaging from previsou exam, outside providers and external EMR if available.   As well as notes that were available from care everywhere and other healthcare systems.  Past medical history, social, surgical and family history all reviewed in electronic medical record.  [name] pertanent information unless stated regarding to the chief complaint.   Past Medical History:  Diagnosis Date   Eczema     Allergies  Allergen Reactions   Penicillins Anaphylaxis and Other (See Comments)     Review of Systems:  [name] headache, visual changes, nausea, vomiting, diarrhea, constipation, dizziness, abdominal pain, skin rash, fevers, chills, night sweats, weight loss, swollen lymph nodes, body aches, joint swelling, chest pain, shortness of breath, mood changes. POSITIVE muscle aches  Objective  Blood pressure 108/74, pulse 72, height '6\' 1"'$  (1.854 m), weight 215 lb (97.5 kg), SpO2 99 %.   General: [name] apparent distress alert and oriented x3 mood and affect normal, dressed appropriately.  HEENT: Pupils equal, extraocular movements intact  Respiratory: Patient's speak in full sentences and does not appear short of breath  Cardiovascular: [name] lower extremity edema, non tender, [name] erythema  Lordosis.  Tenderness to palpation over the sacroiliac joint.  Seems to be more right greater then left   Osteopathic findings   T9 extended  rotated and side bent left L2 flexed rotated and side bent right Sacrum right on right       Assessment and Plan:  Low back pain You discussed HEP discussed with patient follow-up cyst as well as the potential for the left L5 nerve root.  Patient was to continue to monitor.  Worsening pain consider the nerve root injection.  We can always consider repeat MRI as well to see if this is [name] significant changes.  Does respond to manipulation relatively well.  Follow-up again in 8 to 12 weeks    Nonallopathic problems  Decision today to treat with OMT was based on Physical Exam  After verbal consent patient was treated with HVLA, ME, FPR techniques in  thoracic, lumbar, and sacral  areas  Patient tolerated the procedure well with improvement in symptoms  Patient given exercises, stretches and lifestyle modifications  See medications in patient instructions if given  Patient will follow up in 8 weeks    The above documentation has been reviewed and is accurate and complete William Pulley, DO          Note: This dictation was prepared with Dragon dictation along with smaller phrase technology. Any transcriptional errors that result from this process are unintentional.

## 2022-06-15 ENCOUNTER — Ambulatory Visit (INDEPENDENT_AMBULATORY_CARE_PROVIDER_SITE_OTHER): Payer: Commercial Managed Care - PPO | Admitting: Family Medicine

## 2022-06-15 VITALS — BP 108/74 | HR 72 | Ht 73.0 in | Wt 215.0 lb

## 2022-06-15 DIAGNOSIS — G8929 Other chronic pain: Secondary | ICD-10-CM

## 2022-06-15 DIAGNOSIS — M2550 Pain in unspecified joint: Secondary | ICD-10-CM

## 2022-06-15 DIAGNOSIS — M9902 Segmental and somatic dysfunction of thoracic region: Secondary | ICD-10-CM

## 2022-06-15 DIAGNOSIS — M9903 Segmental and somatic dysfunction of lumbar region: Secondary | ICD-10-CM

## 2022-06-15 DIAGNOSIS — M545 Low back pain, unspecified: Secondary | ICD-10-CM | POA: Diagnosis not present

## 2022-06-15 DIAGNOSIS — M9904 Segmental and somatic dysfunction of sacral region: Secondary | ICD-10-CM

## 2022-06-15 NOTE — Assessment & Plan Note (Signed)
You discussed HEP discussed with patient follow-up cyst as well as the potential for the left L5 nerve root.  Patient was to continue to monitor.  Worsening pain consider the nerve root injection.  We can always consider repeat MRI as well to see if this is No significant changes.  Does respond to manipulation relatively well.  Follow-up again in 8 to 12 weeks

## 2022-06-15 NOTE — Patient Instructions (Signed)
Yoga wheel or inversion table See me in 10-12 weeks

## 2022-06-17 ENCOUNTER — Other Ambulatory Visit: Payer: Commercial Managed Care - PPO

## 2022-06-17 LAB — COMPREHENSIVE METABOLIC PANEL
ALT: 15 U/L (ref 0–53)
AST: 21 U/L (ref 0–37)
Albumin: 4.4 g/dL (ref 3.5–5.2)
Alkaline Phosphatase: 50 U/L (ref 39–117)
BUN: 13 mg/dL (ref 6–23)
CO2: 30 mEq/L (ref 19–32)
Calcium: 9.3 mg/dL (ref 8.4–10.5)
Chloride: 102 mEq/L (ref 96–112)
Creatinine, Ser: 0.77 mg/dL (ref 0.40–1.50)
GFR: 112.64 mL/min (ref >60.00)
Glucose, Bld: 93 mg/dL (ref 70–99)
Potassium: 4.1 mEq/L (ref 3.5–5.1)
Sodium: 138 mEq/L (ref 135–145)
Total Bilirubin: 0.4 mg/dL (ref 0.2–1.2)
Total Protein: 6.8 g/dL (ref 6.0–8.3)

## 2022-06-17 LAB — CBC WITH DIFFERENTIAL/PLATELET
Basophils Absolute: 0 10*3/uL (ref 0.0–0.1)
Basophils Relative: 0.4 % (ref 0.0–3.0)
Eosinophils Absolute: 0.1 10*3/uL (ref 0.0–0.7)
Eosinophils Relative: 1.6 % (ref 0.0–5.0)
HCT: 40.4 % (ref 39.0–52.0)
Hemoglobin: 13.7 g/dL (ref 13.0–17.0)
Lymphocytes Relative: 27.2 % (ref 12.0–46.0)
Lymphs Abs: 1.8 10*3/uL (ref 0.7–4.0)
MCHC: 33.8 g/dL (ref 30.0–36.0)
MCV: 86.5 fl (ref 78.0–100.0)
Monocytes Absolute: 0.6 10*3/uL (ref 0.1–1.0)
Monocytes Relative: 8.7 % (ref 3.0–12.0)
Neutro Abs: 4.2 10*3/uL (ref 1.4–7.7)
Neutrophils Relative %: 62.1 % (ref 43.0–77.0)
Platelets: 176 10*3/uL (ref 150.0–400.0)
RBC: 4.66 Mil/uL (ref 4.22–5.81)
RDW: 13 % (ref 11.5–15.5)
WBC: 6.7 10*3/uL (ref 4.0–10.5)

## 2022-06-17 LAB — VITAMIN D 25 HYDROXY (VIT D DEFICIENCY, FRACTURES): VITD: 17.39 ng/mL — ABNORMAL LOW (ref 30.00–100.00)

## 2022-06-17 LAB — SEDIMENTATION RATE: Sed Rate: 17 mm/hr — ABNORMAL HIGH (ref 0–15)

## 2022-06-17 LAB — VITAMIN B12: Vitamin B-12: 283 pg/mL (ref 211–911)

## 2022-06-21 ENCOUNTER — Other Ambulatory Visit: Payer: Self-pay | Admitting: Family Medicine

## 2022-06-21 MED ORDER — VITAMIN D (ERGOCALCIFEROL) 1.25 MG (50000 UNIT) PO CAPS: 50000.0000 [IU] | ORAL_CAPSULE | ORAL | 0 refills | Status: DC

## 2022-08-23 NOTE — Progress Notes (Unsigned)
  [place] Phone: [phone] Subjective:   William [name], am serving as a scribe for Dr. Hulan [name].  I'm seeing this patient by the request  of:  William Loader, FNP  CC: Back and neck pain follow-up  [id number]  William [name] is a 42 y.o. male coming in with complaint of back and neck pain. OMT on 06/15/2022. Patient states that he has neck soreness on and off. Lumbar spine has been manageable since last visit. Pain typically more on the L side.   Medications patient has been prescribed: Vit D  Taking:       Past Medical History:  Diagnosis Date   Eczema     Allergies  Allergen Reactions   Penicillins Anaphylaxis and Other (See Comments)      Objective  Blood pressure 108/70, pulse 65, height '6\' 1"'$  (1.854 m), weight 216 lb (98 kg), SpO2 98 %.   General: [name] apparent distress alert and oriented x3 mood and affect normal, dressed appropriately.  HEENT: Pupils equal, extraocular movements intact  Respiratory: Patient's speak in full sentences and does not appear short of breath  Cardiovascular: [name] lower extremity edema, non tender, [name] erythema  Gait MSK:  Back low back exam does have some loss of lordosis.  Still some tightness noted in the parascapular area as well.  This parascapular area is on the right side in the low back seems to be more on the left side.  Osteopathic findings  C7 flexed rotated and side bent left T3 extended rotated and side bent right inhaled rib T 7extended rotated and side bent left L2 flexed rotated and side bent right L4 flexed rotated and side bent left Sacrum right on right       Assessment and Plan:  Low back pain Continues to have the low back pain.  Does have the disc protrusion at L4-L5 and could consider also nerve root impingement as well as injections.  Patient still wants to try the home exercises, continuing the core strengthening.   Discussed icing regimen.  Follow-up with me again in 6 to 8 weeks.    Nonallopathic problems  Decision today to treat with OMT was based on Physical Exam  After verbal consent patient was treated with HVLA, ME, FPR techniques in cervical, rib, thoracic, lumbar, and sacral  areas  Patient tolerated the procedure well with improvement in symptoms  Patient given exercises, stretches and lifestyle modifications  See medications in patient instructions if given  Patient will follow up in 4-8 weeks      The above documentation has been reviewed and is accurate and complete William Pulley, DO        Note: This dictation was prepared with Dragon dictation along with smaller phrase technology. Any transcriptional errors that result from this process are unintentional.

## 2022-08-24 ENCOUNTER — Ambulatory Visit (INDEPENDENT_AMBULATORY_CARE_PROVIDER_SITE_OTHER): Payer: Commercial Managed Care - PPO | Admitting: Family Medicine

## 2022-08-24 VITALS — BP 108/70 | HR 65 | Ht 73.0 in | Wt 216.0 lb

## 2022-08-24 DIAGNOSIS — M9903 Segmental and somatic dysfunction of lumbar region: Secondary | ICD-10-CM | POA: Diagnosis not present

## 2022-08-24 DIAGNOSIS — M9904 Segmental and somatic dysfunction of sacral region: Secondary | ICD-10-CM | POA: Diagnosis not present

## 2022-08-24 DIAGNOSIS — M545 Low back pain, unspecified: Secondary | ICD-10-CM

## 2022-08-24 DIAGNOSIS — M9902 Segmental and somatic dysfunction of thoracic region: Secondary | ICD-10-CM | POA: Diagnosis not present

## 2022-08-24 DIAGNOSIS — G8929 Other chronic pain: Secondary | ICD-10-CM

## 2022-08-24 DIAGNOSIS — M9901 Segmental and somatic dysfunction of cervical region: Secondary | ICD-10-CM | POA: Diagnosis not present

## 2022-08-24 DIAGNOSIS — M9908 Segmental and somatic dysfunction of rib cage: Secondary | ICD-10-CM

## 2022-08-24 MED ORDER — VITAMIN D (ERGOCALCIFEROL) 1.25 MG (50000 UNIT) PO CAPS: 50000.0000 [IU] | ORAL_CAPSULE | ORAL | 0 refills | Status: DC

## 2022-08-24 NOTE — Assessment & Plan Note (Signed)
Continues to have the low back pain.  Does have the disc protrusion at L4-L5 and could consider also nerve root impingement as well as injections.  Patient still wants to try the home exercises, continuing the core strengthening.  Discussed icing regimen.  Follow-up with me again in 6 to 8 weeks.

## 2022-08-24 NOTE — Patient Instructions (Signed)
Good to see you You are making slow progress Refilled Vit D See me in 10-12 weeks

## 2022-10-03 NOTE — Progress Notes (Signed)
[facility] [address] [place] [address] Phone: [phone] Subjective:   William [name], am serving as a scribe for Dr. Antoine Primas.  I'm seeing this patient by the request  of:  Soundra Pilon, FNP  CC: low back pain follow up   [id number]  08/24/2022 Continues to have the low back pain.  Does have the disc protrusion at L4-L5 and could consider also nerve root impingement as well as injections.  Patient still wants to try the home exercises, continuing the core strengthening.  Discussed icing regimen.  Follow-up with me again in 6 to 8 weeks.      Update 10/04/2022 William [name] is a 42 y.o. male coming in with complaint of LBP. Patient states that he pulled his back since last visit and moved his appointment to earlier. Did have some radicular symptoms the first night after injury.      Past Medical History:  Diagnosis Date   Eczema    No past surgical history on file. Social History   Socioeconomic History   Marital status: Married    Spouse name: Not on file   Number of children: Not on file   Years of education: Not on file   Highest education level: Not on file  Occupational History   Not on file  Tobacco Use   Smoking status: Former   Smokeless tobacco: Never  Vaping Use   Vaping Use: Never used  Substance and Sexual Activity   Alcohol use: Yes   Drug use: Never   Sexual activity: Not on file  Other Topics Concern   Not on file  Social History Narrative   ** Merged History Encounter **       Social Determinants of Health   Financial Resource Strain: Not on file  Food Insecurity: Not on file  Transportation Needs: Not on file  Physical Activity: Not on file  Stress: Not on file  Social Connections: Not on file   Allergies  Allergen Reactions   Penicillins Anaphylaxis and Other (See Comments)   Family History  Problem Relation Age of Onset   Allergic rhinitis Mother    Allergic rhinitis Sister     Allergic rhinitis Sister        Current Outpatient Medications (Analgesics):    naproxen sodium (ALEVE) 220 MG tablet, 1-2 tablets with food or milk as needed   Current Outpatient Medications (Other):    ALPRAZolam (XANAX) 1 MG tablet, 1 tablet   tiZANidine (ZANAFLEX) 2 MG tablet, TAKE 1 TABLET BY MOUTH EVERYDAY AT BEDTIME   Vitamin D, Ergocalciferol, (DRISDOL) 1.25 MG (50000 UNIT) CAPS capsule, Take 1 capsule (50,000 Units total) by mouth every 7 (seven) days.   Reviewed prior external information including notes and imaging from  primary care provider As well as notes that were available from care everywhere and other healthcare systems.  Past medical history, social, surgical and family history all reviewed in electronic medical record.  No pertanent information unless stated regarding to the chief complaint.   Review of Systems:  No headache, visual changes, nausea, vomiting, diarrhea, constipation, dizziness, abdominal pain, skin rash, fevers, chills, night sweats, weight loss, swollen lymph nodes, body aches, joint swelling, chest pain, shortness of breath, mood changes. POSITIVE muscle aches  Objective  Blood pressure 110/78, pulse 67, height 6\' 1"  (1.854 m), weight 223 lb (101.2 kg), SpO2 98 %.   General: No apparent distress alert and oriented x3 mood and affect normal, dressed appropriately.  HEENT: Pupils equal, extraocular movements intact  Respiratory: Patient's speak in full sentences and does not appear short of breath  Cardiovascular: No lower extremity edema, non tender, no erythema  Low back does have loss of lordosis patient does have some tightness noted with Pearlean Brownie right greater than left.  Osteopathic findings C2 flexed rotated and side bent right C5 flexed rotated and side bent left T3 extended rotated and side bent right inhaled third rib T9 extended rotated and side bent left L3 flexed rotated and side bent right Sacrum right on right     Impression  and Recommendations:    The above documentation has been reviewed and is accurate and complete Judi Saa, DO

## 2022-10-04 ENCOUNTER — Encounter: Payer: Self-pay | Admitting: Family Medicine

## 2022-10-04 ENCOUNTER — Ambulatory Visit (INDEPENDENT_AMBULATORY_CARE_PROVIDER_SITE_OTHER): Payer: Commercial Managed Care - PPO | Admitting: Family Medicine

## 2022-10-04 VITALS — BP 110/78 | HR 67 | Ht 73.0 in | Wt 223.0 lb

## 2022-10-04 DIAGNOSIS — M9903 Segmental and somatic dysfunction of lumbar region: Secondary | ICD-10-CM

## 2022-10-04 DIAGNOSIS — M9908 Segmental and somatic dysfunction of rib cage: Secondary | ICD-10-CM

## 2022-10-04 DIAGNOSIS — M545 Low back pain, unspecified: Secondary | ICD-10-CM | POA: Diagnosis not present

## 2022-10-04 DIAGNOSIS — G8929 Other chronic pain: Secondary | ICD-10-CM | POA: Diagnosis not present

## 2022-10-04 DIAGNOSIS — M9901 Segmental and somatic dysfunction of cervical region: Secondary | ICD-10-CM | POA: Diagnosis not present

## 2022-10-04 DIAGNOSIS — M9902 Segmental and somatic dysfunction of thoracic region: Secondary | ICD-10-CM

## 2022-10-04 DIAGNOSIS — M9904 Segmental and somatic dysfunction of sacral region: Secondary | ICD-10-CM | POA: Diagnosis not present

## 2022-10-04 NOTE — Patient Instructions (Signed)
Good to see you Think it was a fluke Last MRI was July 10th See me again in 5-6 weeks

## 2022-10-04 NOTE — Assessment & Plan Note (Signed)
Concern again is secondary to the exacerbation.  Discussed which activities to do and which ones to avoid, increase activity slowly.  We discussed with patient posture and ergonomics again.  Patient has deferred medication including the muscle relaxer that did help him with the breakthrough.  Discussed which activities to do and which ones to avoid.  Discussed with patient that with the known MRI possible injections or with her being nearly 42 years old would reconsider the possibility of repeating the injections.  Discussed with patient about icing regimen and home exercises, discussed which activities to do and which ones to avoid.  Follow-up again in 5 to 6 weeks otherwise.

## 2022-10-24 LAB — LAB REPORT - SCANNED: EGFR (Non-African Amer.): 111

## 2022-11-01 NOTE — Progress Notes (Signed)
  [facility] [address] [place] [address] Phone: [phone] Subjective:   William [name], am serving as a scribe for Dr. Antoine Primas.  I'm seeing this patient by the request  of:  Soundra Pilon, FNP  CC: back and neck pain follow up   [id number]  Fed Villafuerte is a 42 y.o. male coming in with complaint of back and neck pain. OMT 10/04/2022. Patient states that his neck pain is intermittent.   Medications patient has been prescribed: Vit D         Reviewed prior external information including notes and imaging from previsou exam, outside providers and external EMR if available.   As well as notes that were available from care everywhere and other healthcare systems.  Past medical history, social, surgical and family history all reviewed in electronic medical record.  No pertanent information unless stated regarding to the chief complaint.   Past Medical History:  Diagnosis Date   Eczema     Allergies  Allergen Reactions   Penicillins Anaphylaxis and Other (See Comments)     Review of Systems:  No headache, visual changes, nausea, vomiting, diarrhea, constipation, dizziness, abdominal pain, skin rash, fevers, chills, night sweats, weight loss, swollen lymph nodes, body aches, joint swelling, chest pain, shortness of breath, mood changes. POSITIVE muscle aches  Objective  Blood pressure 112/82, pulse (!) 57, height 6\' 1"  (1.854 m), weight 223 lb (101.2 kg), SpO2 98 %.   General: No apparent distress alert and oriented x3 mood and affect normal, dressed appropriately.  HEENT: Pupils equal, extraocular movements intact  Respiratory: Patient's speak in full sentences and does not appear short of breath  Cardiovascular: No lower extremity edema, non tender, no erythema  Back does loss of lordosis noted.  Tightness noted of the neck seems to be on the right at C2 area more than the left.  Osteopathic findings  C2 flexed  rotated and side bent right C6 flexed rotated and side bent left T3 extended rotated and side bent right inhaled rib T9 extended rotated and side bent left L2 flexed rotated and side bent right L5 flexed rotated and side bent left Sacrum right on right    Assessment and Plan:  Low back pain Low back exam does have some loss of lordosis noted.  Patient in the paraspinal region.  Patient does have tightness with Pearlean Brownie and we will continue to watch the spondylolisthesis significantly radicular symptoms may need to consider other treatment options.  Patient hopefully will continue to do well with conservative therapy.  Follow-up with me again in 2 months    Nonallopathic problems  Decision today to treat with OMT was based on Physical Exam  After verbal consent patient was treated with HVLA, ME, FPR techniques in cervical, rib, thoracic, lumbar, and sacral  areas  Patient tolerated the procedure well with improvement in symptoms  Patient given exercises, stretches and lifestyle modifications  See medications in patient instructions if given  Patient will follow up in 8 weeks    The above documentation has been reviewed and is accurate and complete Judi Saa, DO           Note: This dictation was prepared with Dragon dictation along with smaller phrase technology. Any transcriptional errors that result from this process are unintentional.

## 2022-11-02 ENCOUNTER — Ambulatory Visit: Payer: Commercial Managed Care - PPO | Admitting: Family Medicine

## 2022-11-08 ENCOUNTER — Ambulatory Visit (INDEPENDENT_AMBULATORY_CARE_PROVIDER_SITE_OTHER): Payer: Commercial Managed Care - PPO | Admitting: Family Medicine

## 2022-11-08 ENCOUNTER — Encounter: Payer: Self-pay | Admitting: Family Medicine

## 2022-11-08 ENCOUNTER — Ambulatory Visit (INDEPENDENT_AMBULATORY_CARE_PROVIDER_SITE_OTHER): Payer: Commercial Managed Care - PPO

## 2022-11-08 VITALS — BP 112/82 | HR 57 | Ht 73.0 in | Wt 223.0 lb

## 2022-11-08 DIAGNOSIS — M9904 Segmental and somatic dysfunction of sacral region: Secondary | ICD-10-CM | POA: Diagnosis not present

## 2022-11-08 DIAGNOSIS — M25562 Pain in left knee: Secondary | ICD-10-CM | POA: Insufficient documentation

## 2022-11-08 DIAGNOSIS — M545 Low back pain, unspecified: Secondary | ICD-10-CM

## 2022-11-08 DIAGNOSIS — M9903 Segmental and somatic dysfunction of lumbar region: Secondary | ICD-10-CM

## 2022-11-08 DIAGNOSIS — M25512 Pain in left shoulder: Secondary | ICD-10-CM

## 2022-11-08 DIAGNOSIS — G8929 Other chronic pain: Secondary | ICD-10-CM | POA: Diagnosis not present

## 2022-11-08 DIAGNOSIS — M9908 Segmental and somatic dysfunction of rib cage: Secondary | ICD-10-CM | POA: Diagnosis not present

## 2022-11-08 DIAGNOSIS — M9901 Segmental and somatic dysfunction of cervical region: Secondary | ICD-10-CM

## 2022-11-08 DIAGNOSIS — M9902 Segmental and somatic dysfunction of thoracic region: Secondary | ICD-10-CM

## 2022-11-08 NOTE — Assessment & Plan Note (Signed)
Low back exam does have some loss of lordosis noted.  Patient in the paraspinal region.  Patient does have tightness with Pearlean Brownie and we will continue to watch the spondylolisthesis significantly radicular symptoms may need to consider other treatment options.  Patient hopefully will continue to do well with conservative therapy.  Follow-up with me again in 2 months

## 2022-11-08 NOTE — Patient Instructions (Addendum)
Good to see you Xray L shoulder  Xray L knee See me in 2-3 months

## 2022-11-08 NOTE — Assessment & Plan Note (Signed)
Patient describes left shoulder pain.  Discussed with patient at great length.  I do not feel that any advanced imaging is warranted at this time.  Will get x-rays to rule out anything else.

## 2022-11-08 NOTE — Assessment & Plan Note (Signed)
Likely multifactorial, past medical history significant for a meniscal injury.  Will monitor.  No locking or giving out on him at this time.  Follow-up in 6 to 8 weeks

## 2022-11-10 ENCOUNTER — Encounter: Payer: Self-pay | Admitting: Family Medicine

## 2022-11-11 ENCOUNTER — Other Ambulatory Visit: Payer: Self-pay

## 2022-11-11 DIAGNOSIS — G96191 Perineural cyst: Secondary | ICD-10-CM

## 2022-11-21 ENCOUNTER — Other Ambulatory Visit: Payer: Self-pay | Admitting: Family Medicine

## 2022-11-23 ENCOUNTER — Encounter: Payer: Self-pay | Admitting: Family Medicine

## 2022-11-23 ENCOUNTER — Ambulatory Visit
Admission: RE | Admit: 2022-11-23 | Discharge: 2022-11-23 | Disposition: A | Discharge status: Home / Self Care | Payer: Commercial Managed Care - PPO | Source: Ambulatory Visit | Attending: Family Medicine | Admitting: Family Medicine

## 2022-11-23 DIAGNOSIS — G96191 Perineural cyst: Secondary | ICD-10-CM

## 2022-12-26 NOTE — Progress Notes (Signed)
[name] [address] [place] [address] Phone: [phone] Subjective:   [name], am serving as a scribe for Dr. Antoine Primas.  I'm seeing this patient by the request  of:  Soundra Pilon, FNP  CC: Back pain follow-up  [id number]  11/08/2022 Low back exam does have some loss of lordosis noted.  Patient in the paraspinal region.  Patient does have tightness with Pearlean Brownie and we will continue to watch the spondylolisthesis significantly radicular symptoms may need to consider other treatment options.  Patient hopefully will continue to do well with conservative therapy.  Follow-up with me again in 2 months      Patient describes left shoulder pain. Discussed with patient at great length. I do not feel that any advanced imaging is warranted at this time. Will get x-rays to rule out anything else.   Likely multifactorial, past medical history significant for a meniscal injury.  Will monitor.  No locking or giving out on him at this time.  Follow-up in 6 to 8 weeks    Update 12/30/2022 William [name] is a 42 y.o. male coming in with complaint of L knee, LBP, and L shoulder pain. Patient states back doing okay. Feeling stable. Both shoulder and knee are also doing well. Talk about back MRI.   MRI lumbar 11/23/2022 IMPRESSION: Unremarkable lumbar spine MRI examination. No significant disc protrusions, spinal or foraminal stenosis.  Xray L knee 11/08/2022 IMPRESSION: Minimal medial and patellofemoral compartment osteoarthritis.  Xray L shoulder 11/08/2022 Normal  Past Medical History:  Diagnosis Date   Eczema    No past surgical history on file. Social History   Socioeconomic History   Marital status: Married    Spouse name: Not on file   Number of children: Not on file   Years of education: Not on file   Highest education level: Not on file  Occupational History   Not on file  Tobacco Use   Smoking status: Former   Smokeless  tobacco: Never  Vaping Use   Vaping status: Never Used  Substance and Sexual Activity   Alcohol use: Yes   Drug use: Never   Sexual activity: Not on file  Other Topics Concern   Not on file  Social History Narrative   ** Merged History Encounter **       Social Determinants of Health   Financial Resource Strain: Low Risk  (03/05/2021)   Received from [facility]   Overall Financial Resource Strain (CARDIA)    Difficulty of Paying Living Expenses: Not very hard  Food Insecurity: No Food Insecurity (03/05/2021)   Received from [facility]   Hunger Vital Sign    Worried About Running Out of Food in the Last Year: Never true    Ran Out of Food in the Last Year: Never true  Transportation Needs: No Transportation Needs (03/05/2021)   Received [facility] - [facility]    Lack of Transportation (Medical): No    Lack of Transportation (Non-Medical): No  Physical Activity: Sufficiently Active (03/05/2021)   Received from [facility]   Exercise Vital Sign    Days of Exercise per Week: 3 days    Minutes of Exercise per Session: 50 min  Stress: No Stress Concern Present (03/05/2021)   Received from [facility] - Occupational Stress Questionnaire    Feeling of Stress : Not at all  Social Connections: Unknown (11/02/2021)  Received from [facility]   Social Network    Social Network: Not on file   Allergies  Allergen Reactions   Penicillins Anaphylaxis and Other (See Comments)   Family History  Problem Relation Age of Onset   Allergic rhinitis Mother    Allergic rhinitis Sister    Allergic rhinitis Sister        Current Outpatient Medications (Analgesics):    naproxen sodium (ALEVE) 220 MG tablet, 1-2 tablets with food or milk as needed   Current Outpatient Medications (Other):    ALPRAZolam (XANAX) 1 MG tablet, 1 tablet   tiZANidine (ZANAFLEX) 2 MG tablet, TAKE 1 TABLET BY MOUTH EVERYDAY AT  BEDTIME   Vitamin D, Ergocalciferol, (DRISDOL) 1.25 MG (50000 UNIT) CAPS capsule, TAKE 1 CAPSULE (50,000 UNITS TOTAL) BY MOUTH EVERY 7 (SEVEN) DAYS   Reviewed prior external information including notes and imaging from  primary care provider As well as notes that were available from care everywhere and other healthcare systems.  Past medical history, social, surgical and family history all reviewed in electronic medical record.  No pertanent information unless stated regarding to the chief complaint.   Review of Systems:  No headache, visual changes, nausea, vomiting, diarrhea, constipation, dizziness, abdominal pain, skin rash, fevers, chills, night sweats, weight loss, swollen lymph nodes, body aches, joint swelling, chest pain, shortness of breath, mood changes. POSITIVE muscle aches  Objective  Blood pressure 122/80, pulse 69, height 6\' 1"  (1.854 m), weight 229 lb (103.9 kg), SpO2 98%.   General: No apparent distress alert and oriented x3 mood and affect normal, dressed appropriately.  HEENT: Pupils equal, extraocular movements intact  Respiratory: Patient's speak in full sentences and does not appear short of breath  Cardiovascular: No lower extremity edema, non tender, no erythema  Low back exam does have some loss of lordosis noted.  Some tenderness to palpation in the paraspinal musculature.  Tightness still noted left greater than right.  Osteopathic findings C2 flexed rotated and side bent right C6 flexed rotated and side bent right T6 extended rotated and side bent left L2 flexed rotated and side bent right Sacrum left on left     Impression and Recommendations:      The above documentation has been reviewed and is accurate and complete Judi Saa, DO

## 2022-12-30 ENCOUNTER — Encounter: Payer: Self-pay | Admitting: Family Medicine

## 2022-12-30 ENCOUNTER — Ambulatory Visit (INDEPENDENT_AMBULATORY_CARE_PROVIDER_SITE_OTHER): Payer: Commercial Managed Care - PPO | Admitting: Family Medicine

## 2022-12-30 VITALS — BP 122/80 | HR 69 | Ht 73.0 in | Wt 229.0 lb

## 2022-12-30 DIAGNOSIS — M9902 Segmental and somatic dysfunction of thoracic region: Secondary | ICD-10-CM

## 2022-12-30 DIAGNOSIS — M545 Low back pain, unspecified: Secondary | ICD-10-CM | POA: Diagnosis not present

## 2022-12-30 DIAGNOSIS — M9903 Segmental and somatic dysfunction of lumbar region: Secondary | ICD-10-CM

## 2022-12-30 DIAGNOSIS — M9904 Segmental and somatic dysfunction of sacral region: Secondary | ICD-10-CM | POA: Diagnosis not present

## 2022-12-30 DIAGNOSIS — G8929 Other chronic pain: Secondary | ICD-10-CM | POA: Diagnosis not present

## 2022-12-30 NOTE — Assessment & Plan Note (Signed)
   Decision today to treat with OMT was based on Physical Exam  After verbal consent patient was treated with HVLA, ME, FPR techniques in cervical, thoracic,  lumbar and sacral areas, all areas are chronic   Patient tolerated the procedure well with improvement in symptoms  Patient given exercises, stretches and lifestyle modifications  See medications in patient instructions if given  Patient will follow up in 4-8 weeks 

## 2022-12-30 NOTE — Patient Instructions (Signed)
Overall MRI looks good Think you're doing great Continue Vit D for now See you again in 2-3 months

## 2022-12-30 NOTE — Assessment & Plan Note (Addendum)
Discussed with patient again at this time.  No increase in the volume of the cyst.  Discussed that at this moment does not seem to have potentially any impingement from the cyst is not likely.  We could consider referral to neurosurgery to discuss further but I do not think it would change medical management at this point.  Discussed home exercises and icing regimen otherwise.  Discussed continuing to work on core strengthening.  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  Would like to see him back again in 3 months.

## 2023-03-06 NOTE — Progress Notes (Unsigned)
[facility] [address] [place] [address] Phone: [phone] Subjective:   [name], am serving as a scribe for Dr. Antoine Primas.  I'm seeing this patient by the request  of:  Soundra Pilon, FNP  CC: Back and neck pain follow-up  [id number]  William [name] is a 42 y.o. male coming in with complaint of back and neck pain. OMT 12/30/2022. Patient states overall has been doing relatively well.  Has some tightness because patient did have to do a lot of shoveling after he had some stumps were removed and he is pretty hard.  Denies though any radiation down the leg, any pain that stopping from activity.  Has not had to take any significant medications on a regular basis at the moment.  Patient is having his wife do in the next 4 weeks with his second child.  Medications patient has been prescribed: Vit D   Taking:         Reviewed prior external information including notes and imaging from previsou exam, outside providers and external EMR if available.   As well as notes that were available from care everywhere and other healthcare systems.  Past medical history, social, surgical and family history all reviewed in electronic medical record.  No pertanent information unless stated regarding to the chief complaint.   Past Medical History:  Diagnosis Date   Eczema     Allergies  Allergen Reactions   Penicillins Anaphylaxis and Other (See Comments)     Review of Systems:  No headache, visual changes, nausea, vomiting, diarrhea, constipation, dizziness, abdominal pain, skin rash, fevers, chills, night sweats, weight loss, swollen lymph nodes, body aches, joint swelling, chest pain, shortness of breath, mood changes. POSITIVE muscle aches  Objective  Height 6\' 1"  (1.854 m), weight 230 lb (104.3 kg).   General: No apparent distress alert and oriented x3 mood and affect normal, dressed appropriately.  HEENT: Pupils equal,  extraocular movements intact  Respiratory: Patient's speak in full sentences and does not appear short of breath  Cardiovascular: No lower extremity edema, non tender, no erythema  Low back exam does have some loss of lordosis noted.  Some tenderness to palpation in the paraspinal musculature.  Tightness noted right greater than left.  Osteopathic findings  C7 flexed rotated and side bent left T3 extended rotated and side bent right inhaled rib T9 extended rotated and side bent left L2 flexed rotated and side bent right L3 flexed rotated and side bent left L5 flexed rotated and side bent right Sacrum right on right       Assessment and Plan:  Low back pain Low back does have some loss of lordosis noted.  Some tenderness to palpation in the paraspinal musculature.  Discussed icing regimen and home exercises.  Increase activity slowly.  Discussed which activities to do and which ones to avoid.  Follow-up again in 6 to 8 weeks otherwise.    Nonallopathic problems  Decision today to treat with OMT was based on Physical Exam  After verbal consent patient was treated with HVLA, ME, FPR techniques in cervical, rib, thoracic, lumbar, and sacral  areas  Patient tolerated the procedure well with improvement in symptoms  Patient given exercises, stretches and lifestyle modifications  See medications in patient instructions if given  Patient will follow up in 4-8 weeks    The above documentation has been reviewed and is accurate and complete Judi Saa, DO  Note: This dictation was prepared with Dragon dictation along with smaller phrase technology. Any transcriptional errors that result from this process are unintentional.

## 2023-03-07 ENCOUNTER — Encounter: Payer: Self-pay | Admitting: Family Medicine

## 2023-03-07 ENCOUNTER — Ambulatory Visit (INDEPENDENT_AMBULATORY_CARE_PROVIDER_SITE_OTHER): Payer: Commercial Managed Care - PPO | Admitting: Family Medicine

## 2023-03-07 VITALS — Ht 73.0 in | Wt 230.0 lb

## 2023-03-07 DIAGNOSIS — G8929 Other chronic pain: Secondary | ICD-10-CM

## 2023-03-07 DIAGNOSIS — M545 Low back pain, unspecified: Secondary | ICD-10-CM | POA: Diagnosis not present

## 2023-03-07 DIAGNOSIS — M9901 Segmental and somatic dysfunction of cervical region: Secondary | ICD-10-CM | POA: Diagnosis not present

## 2023-03-07 DIAGNOSIS — M9902 Segmental and somatic dysfunction of thoracic region: Secondary | ICD-10-CM

## 2023-03-07 DIAGNOSIS — M9904 Segmental and somatic dysfunction of sacral region: Secondary | ICD-10-CM | POA: Diagnosis not present

## 2023-03-07 DIAGNOSIS — M9908 Segmental and somatic dysfunction of rib cage: Secondary | ICD-10-CM | POA: Diagnosis not present

## 2023-03-07 DIAGNOSIS — M9903 Segmental and somatic dysfunction of lumbar region: Secondary | ICD-10-CM

## 2023-03-07 NOTE — Assessment & Plan Note (Signed)
Low back does have some loss of lordosis noted.  Some tenderness to palpation in the paraspinal musculature.  Discussed icing regimen and home exercises.  Increase activity slowly.  Discussed which activities to do and which ones to avoid.  Follow-up again in 6 to 8 weeks otherwise.

## 2023-04-19 NOTE — Progress Notes (Unsigned)
  [facility] [address] [place] [address] Phone: [phone] Subjective:   Bruce Donath, am serving as a scribe for Dr. Antoine Primas.  I'm seeing this patient by the request  of:  Soundra Pilon, FNP  CC: Back and neck pain follow-up  [id number]  William [name] is a 42 y.o. male coming in with complaint of back and neck pain. OMT 03/07/2023. Patient states that he has been doing well since last visit.   Medications patient has been prescribed: Vit D  Taking:       reviewed prior external information including notes and imaging from previsou exam, outside providers and external EMR if available.   As well as notes that were available from care everywhere and other healthcare systems.  Past medical history, social, surgical and family history all reviewed in electronic medical record.  No pertanent information unless stated regarding to the chief complaint.   Past Medical History:  Diagnosis Date   Eczema     Allergies  Allergen Reactions   Penicillins Anaphylaxis and Other (See Comments)     Review of Systems:  No headache, visual changes, nausea, vomiting, diarrhea, constipation, dizziness, abdominal pain, skin rash, fevers, chills, night sweats, weight loss, swollen lymph nodes, body aches, joint swelling, chest pain, shortness of breath, mood changes. POSITIVE muscle aches  Objective  Blood pressure 108/78, pulse 64, height 6\' 1"  (1.854 m), weight 233 lb (105.7 kg), SpO2 96%.   General: No apparent distress alert and oriented x3 mood and affect normal, dressed appropriately.  HEENT: Pupils equal, extraocular movements intact  Respiratory: Patient's speak in full sentences and does not appear short of breath  Cardiovascular: No lower extremity edema, non tender, no erythema    Osteopathic findings  C3 flexed rotated and side bent right C6 flexed rotated and side bent left T4 extended rotated and side bent right  inhaled rib T9 extended rotated and side bent left L2 flexed rotated and side bent right Sacrum right on right     Assessment and Plan:  Low back pain Low back seems to be stable.  Discussed icing regimen and home exercises, which activities to do and which ones to avoid.  Increase activity slowly over the course of next several weeks.  Discussed avoiding certain activities that I do think could be tension and cause some increase in discomfort.  Follow-up again in 6 to 8 weeks    Nonallopathic problems  Decision today to treat with OMT was based on Physical Exam  After verbal consent patient was treated with HVLA, ME, FPR techniques in cervical, rib, thoracic, lumbar, and sacral  areas  Patient tolerated the procedure well with improvement in symptoms  Patient given exercises, stretches and lifestyle modifications  See medications in patient instructions if given  Patient will follow up in 4-8 weeks     The above documentation has been reviewed and is accurate and complete Judi Saa, DO         Note: This dictation was prepared with Dragon dictation along with smaller phrase technology. Any transcriptional errors that result from this process are unintentional.

## 2023-04-20 ENCOUNTER — Encounter: Payer: Self-pay | Admitting: Family Medicine

## 2023-04-20 ENCOUNTER — Ambulatory Visit (INDEPENDENT_AMBULATORY_CARE_PROVIDER_SITE_OTHER): Payer: Commercial Managed Care - PPO | Admitting: Family Medicine

## 2023-04-20 VITALS — BP 108/78 | HR 64 | Ht 73.0 in | Wt 233.0 lb

## 2023-04-20 DIAGNOSIS — M9908 Segmental and somatic dysfunction of rib cage: Secondary | ICD-10-CM

## 2023-04-20 DIAGNOSIS — M9904 Segmental and somatic dysfunction of sacral region: Secondary | ICD-10-CM | POA: Diagnosis not present

## 2023-04-20 DIAGNOSIS — M9903 Segmental and somatic dysfunction of lumbar region: Secondary | ICD-10-CM | POA: Diagnosis not present

## 2023-04-20 DIAGNOSIS — M9901 Segmental and somatic dysfunction of cervical region: Secondary | ICD-10-CM | POA: Diagnosis not present

## 2023-04-20 DIAGNOSIS — M9902 Segmental and somatic dysfunction of thoracic region: Secondary | ICD-10-CM | POA: Diagnosis not present

## 2023-04-20 DIAGNOSIS — M545 Low back pain, unspecified: Secondary | ICD-10-CM | POA: Diagnosis not present

## 2023-04-20 DIAGNOSIS — G8929 Other chronic pain: Secondary | ICD-10-CM

## 2023-04-20 NOTE — Assessment & Plan Note (Signed)
Low back seems to be stable.  Discussed icing regimen and home exercises, which activities to do and which ones to avoid.  Increase activity slowly over the course of next several weeks.  Discussed avoiding certain activities that I do think could be tension and cause some increase in discomfort.  Follow-up again in 6 to 8 weeks

## 2023-04-20 NOTE — Patient Instructions (Signed)
Good to see you Will consider trigger points See me in 2-3 months

## 2023-06-02 NOTE — Progress Notes (Unsigned)
  [facility] [address] [place] [address] Phone: [phone] Subjective:   [name], am serving as a scribe for Dr. Antoine Primas.  I'm seeing this patient by the request  of:  Soundra Pilon, FNP  CC: back and neck pain follow up   [id number]  William [name] is a 42 y.o. male coming in with complaint of back and neck pain. OMT 04/20/2023. Patient states same per usual.  Hurt rib on the R side. Fell on the edge of the couch. Has gotten better.  Medications patient has been prescribed: None  Taking:         Reviewed prior external information including notes and imaging from previsou exam, outside providers and external EMR if available.   As well as notes that were available from care everywhere and other healthcare systems.  Past medical history, social, surgical and family history all reviewed in electronic medical record.  No pertanent information unless stated regarding to the chief complaint.   Past Medical History:  Diagnosis Date   Eczema     Allergies  Allergen Reactions   Penicillins Anaphylaxis and Other (See Comments)     Review of Systems:  No headache, visual changes, nausea, vomiting, diarrhea, constipation, dizziness, abdominal pain, skin rash, fevers, chills, night sweats, weight loss, swollen lymph nodes, body aches, joint swelling, chest pain, shortness of breath, mood changes. POSITIVE muscle aches  Objective  Blood pressure 114/74, pulse 76, height 6\' 1"  (1.854 m), weight 237 lb (107.5 kg), SpO2 98%.   General: No apparent distress alert and oriented x3 mood and affect normal, dressed appropriately.  HEENT: Pupils equal, extraocular movements intact  Respiratory: Patient's speak in full sentences and does not appear short of breath  Cardiovascular: No lower extremity edema, non tender, no erythema  MSK:  Back   Osteopathic findings  C2 flexed rotated and side bent right C6 flexed rotated and  side bent left T3 extended rotated and side bent right inhaled rib T9 extended rotated and side bent left L2 flexed rotated and side bent right L4 flexed rotated and side bent left Sacrum right on right    Assessment and Plan:  Low back pain Low back still has the tightness noted.  I believe the patient is doing relatively well.  MRI did show the cyst seem to be decreasing in size.  Could consider the possibility of trigger points but I do think will be okay at this moment.  Follow-up with me again in 6 to 8 weeks  Left shoulder pain Left shoulder does have impingement noted.  Will continue to monitor.  Worsening pain consider possible injection.    Nonallopathic problems  Decision today to treat with OMT was based on Physical Exam  After verbal consent patient was treated with HVLA, ME, FPR techniques in cervical, rib, thoracic, lumbar, and sacral  areas  Patient tolerated the procedure well with improvement in symptoms  Patient given exercises, stretches and lifestyle modifications  See medications in patient instructions if given  Patient will follow up in 4-8 weeks    The above documentation has been reviewed and is accurate and complete Judi Saa, DO          Note: This dictation was prepared with Dragon dictation along with smaller phrase technology. Any transcriptional errors that result from this process are unintentional.

## 2023-06-05 ENCOUNTER — Encounter: Payer: Self-pay | Admitting: Family Medicine

## 2023-06-05 ENCOUNTER — Ambulatory Visit (INDEPENDENT_AMBULATORY_CARE_PROVIDER_SITE_OTHER): Payer: Commercial Managed Care - PPO | Admitting: Family Medicine

## 2023-06-05 VITALS — BP 114/74 | HR 76 | Ht 73.0 in | Wt 237.0 lb

## 2023-06-05 DIAGNOSIS — M9902 Segmental and somatic dysfunction of thoracic region: Secondary | ICD-10-CM

## 2023-06-05 DIAGNOSIS — M25512 Pain in left shoulder: Secondary | ICD-10-CM

## 2023-06-05 DIAGNOSIS — G8929 Other chronic pain: Secondary | ICD-10-CM

## 2023-06-05 DIAGNOSIS — M9908 Segmental and somatic dysfunction of rib cage: Secondary | ICD-10-CM | POA: Diagnosis not present

## 2023-06-05 DIAGNOSIS — M545 Low back pain, unspecified: Secondary | ICD-10-CM | POA: Diagnosis not present

## 2023-06-05 DIAGNOSIS — M9901 Segmental and somatic dysfunction of cervical region: Secondary | ICD-10-CM | POA: Diagnosis not present

## 2023-06-05 DIAGNOSIS — M9903 Segmental and somatic dysfunction of lumbar region: Secondary | ICD-10-CM

## 2023-06-05 DIAGNOSIS — M9904 Segmental and somatic dysfunction of sacral region: Secondary | ICD-10-CM

## 2023-06-05 NOTE — Assessment & Plan Note (Signed)
Low back still has the tightness noted.  I believe the patient is doing relatively well.  MRI did show the cyst seem to be decreasing in size.  Could consider the possibility of trigger points but I do think will be okay at this moment.  Follow-up with me again in 6 to 8 weeks

## 2023-06-05 NOTE — Assessment & Plan Note (Signed)
Left shoulder does have impingement noted.  Will continue to monitor.  Worsening pain consider possible injection.

## 2023-06-05 NOTE — Patient Instructions (Signed)
Reintroduce exercises for shoulder int routine Try to keep hands in peripheral vision See you again in 7-8 weeks

## 2023-07-25 NOTE — Progress Notes (Signed)
  [name] [address] [place] [address] Phone: [phone] Subjective:   [name], am serving as a scribe for Dr. Arthea Claudene.  I'm seeing this patient by the request  of:  William Prentice SAUNDERS, FNP  CC: back and neck pain   [id number]  William [name] is a 43 y.o. male coming in with complaint of back and neck pain. OMT 06/05/2023. Patient states that he is the same as last visit. Has not had to use mm relaxer.   Medications patient has been prescribed: Zanaflex   Taking: Not really taking it         Reviewed prior external information including notes and imaging from previsou exam, outside providers and external EMR if available.   As well as notes that were available from care everywhere and other healthcare systems.  Past medical history, social, surgical and family history all reviewed in electronic medical record.  No pertanent information unless stated regarding to the chief complaint.   Past Medical History:  Diagnosis Date   Eczema     Allergies  Allergen Reactions   Penicillins Anaphylaxis and Other (See Comments)     Review of Systems:  No headache, visual changes, nausea, vomiting, diarrhea, constipation, dizziness, abdominal pain, skin rash, fevers, chills, night sweats, weight loss, swollen lymph nodes, body aches, joint swelling, chest pain, shortness of breath, mood changes. POSITIVE muscle aches  Objective  Blood pressure 112/74, pulse (!) 53, height 6' 1 (1.854 m), weight 238 lb (108 kg), SpO2 99%.   General: No apparent distress alert and oriented x3 mood and affect normal, dressed appropriately.  HEENT: Pupils equal, extraocular movements intact  Respiratory: Patient's speak in full sentences and does not appear short of breath  Cardiovascular: No lower extremity edema, non tender, no erythema  Gait MSK:  Back Exam shows does have loss of lordosis noted.  Some tenderness to palpation in the  paraspinal musculature. Neck exam shows some mild limited sidebending bilaterally.  Osteopathic findings  C2 flexed rotated and side bent right T9 extended rotated and side bent left inhaled. L1 flexed rotated and side bent right L3 flexed rotated and side bent left. Sacrum right on right     Assessment and Plan:  Low back pain Still some chronic issues.  Has not used a muscle relaxer.  Responded extremely well to osteopathic manipulation today after evaluation and treatment.  Discussed icing regimen and home exercises, which activities to do and which ones to avoid.  Increase activity slowly.  Will follow-up again in 6 to 8 weeks.    Nonallopathic problems  Decision today to treat with OMT was based on Physical Exam  After verbal consent patient was treated with HVLA, ME, FPR techniques in cervical, rib, thoracic, lumbar, and sacral  areas  Patient tolerated the procedure well with improvement in symptoms  Patient given exercises, stretches and lifestyle modifications  See medications in patient instructions if given  Patient will follow up in 4-8 weeks     The above documentation has been reviewed and is accurate and complete [name], DO         Note: This dictation was prepared with Dragon dictation along with smaller phrase technology. Any transcriptional errors that result from this process are unintentional.

## 2023-07-26 ENCOUNTER — Ambulatory Visit (INDEPENDENT_AMBULATORY_CARE_PROVIDER_SITE_OTHER): Payer: Commercial Managed Care - PPO | Admitting: Family Medicine

## 2023-07-26 ENCOUNTER — Encounter: Payer: Self-pay | Admitting: Family Medicine

## 2023-07-26 VITALS — BP 112/74 | HR 53 | Ht 73.0 in | Wt 238.0 lb

## 2023-07-26 DIAGNOSIS — M9902 Segmental and somatic dysfunction of thoracic region: Secondary | ICD-10-CM

## 2023-07-26 DIAGNOSIS — M9901 Segmental and somatic dysfunction of cervical region: Secondary | ICD-10-CM

## 2023-07-26 DIAGNOSIS — M9908 Segmental and somatic dysfunction of rib cage: Secondary | ICD-10-CM | POA: Diagnosis not present

## 2023-07-26 DIAGNOSIS — M9903 Segmental and somatic dysfunction of lumbar region: Secondary | ICD-10-CM

## 2023-07-26 DIAGNOSIS — M9904 Segmental and somatic dysfunction of sacral region: Secondary | ICD-10-CM | POA: Diagnosis not present

## 2023-07-26 DIAGNOSIS — M545 Low back pain, unspecified: Secondary | ICD-10-CM | POA: Diagnosis not present

## 2023-07-26 DIAGNOSIS — G8929 Other chronic pain: Secondary | ICD-10-CM

## 2023-07-26 NOTE — Assessment & Plan Note (Signed)
 Still some chronic issues.  Has not used a muscle relaxer.  Responded extremely well to osteopathic manipulation today after evaluation and treatment.  Discussed icing regimen and home exercises, which activities to do and which ones to avoid.  Increase activity slowly.  Will follow-up again in 6 to 8 weeks.

## 2023-07-26 NOTE — Patient Instructions (Signed)
 Good to see you Try to find consistency See me in 6-8 weeks

## 2023-09-07 NOTE — Progress Notes (Unsigned)
  [facility] [address] [place] [address] Phone: [phone] Subjective:   Bruce Donath, am serving as a scribe for Dr. Antoine Primas.  I'm seeing this patient by the request  of:  Soundra Pilon, FNP  CC: Low back pain follow-up  DUK:[id number]  William [name] is a 43 y.o. male coming in with complaint of back and neck pain. OMT 07/26/2023. Patient states that he has been doing well since last visit. Has had a few flare ups that only lasted a couple of days.   Medications patient has been prescribed: None  Taking:         Reviewed prior external information including notes and imaging from previsou exam, outside providers and external EMR if available.   As well as notes that were available from care everywhere and other healthcare systems.  Past medical history, social, surgical and family history all reviewed in electronic medical record.  No pertanent information unless stated regarding to the chief complaint.   Past Medical History:  Diagnosis Date   Eczema     Allergies  Allergen Reactions   Penicillins Anaphylaxis and Other (See Comments)     Review of Systems:  No headache, visual changes, nausea, vomiting, diarrhea, constipation, dizziness, abdominal pain, skin rash, fevers, chills, night sweats, weight loss, swollen lymph nodes, body aches, joint swelling, chest pain, shortness of breath, mood changes. POSITIVE muscle aches  Objective  Blood pressure 110/68, pulse 68, height 6\' 1"  (1.854 m), SpO2 97%.   General: No apparent distress alert and oriented x3 mood and affect normal, dressed appropriately.  HEENT: Pupils equal, extraocular movements intact  Respiratory: Patient's speak in full sentences and does not appear short of breath  Cardiovascular: No lower extremity edema, non tender, no erythema  Gait MSK:  Back does have some loss lordosis noted.  Some tightness noted with Pearlean Brownie right greater than left.  Still  tender over the sacroiliac joints bilaterally.  Osteopathic findings  C2 flexed rotated and side bent right C6 flexed rotated and side bent left T3 extended rotated and side bent right inhaled rib T9 extended rotated and side bent left L3 flexed rotated and side bent left L4 flexed rotated and side bent right Sacrum forward flexion       Assessment and Plan:  Low back pain Patient has the spondylolisthesis with disc protrusion at L4-L5 that we will continue to monitor.  Has some tightness noted of the hip flexor today as well.  Responded well to osteopathic manipulation.  Continue to work on core strengthening.  Discussed icing regimen and home exercises otherwise.  Follow-up again in 6 to 8 weeks    Nonallopathic problems  Decision today to treat with OMT was based on Physical Exam  After verbal consent patient was treated with HVLA, ME, FPR techniques in cervical, rib, thoracic, lumbar, and sacral  areas  Patient tolerated the procedure well with improvement in symptoms  Patient given exercises, stretches and lifestyle modifications  See medications in patient instructions if given  Patient will follow up in 4-8 weeks             Note: This dictation was prepared with Dragon dictation along with smaller phrase technology. Any transcriptional errors that result from this process are unintentional.

## 2023-09-08 ENCOUNTER — Encounter: Payer: Self-pay | Admitting: Family Medicine

## 2023-09-08 ENCOUNTER — Ambulatory Visit: Payer: Commercial Managed Care - PPO | Admitting: Family Medicine

## 2023-09-08 VITALS — BP 110/68 | HR 68 | Ht 73.0 in

## 2023-09-08 DIAGNOSIS — M9908 Segmental and somatic dysfunction of rib cage: Secondary | ICD-10-CM

## 2023-09-08 DIAGNOSIS — M9902 Segmental and somatic dysfunction of thoracic region: Secondary | ICD-10-CM

## 2023-09-08 DIAGNOSIS — M545 Low back pain, unspecified: Secondary | ICD-10-CM

## 2023-09-08 DIAGNOSIS — M9901 Segmental and somatic dysfunction of cervical region: Secondary | ICD-10-CM

## 2023-09-08 DIAGNOSIS — M9904 Segmental and somatic dysfunction of sacral region: Secondary | ICD-10-CM | POA: Diagnosis not present

## 2023-09-08 DIAGNOSIS — G8929 Other chronic pain: Secondary | ICD-10-CM | POA: Diagnosis not present

## 2023-09-08 DIAGNOSIS — M9903 Segmental and somatic dysfunction of lumbar region: Secondary | ICD-10-CM | POA: Diagnosis not present

## 2023-09-08 NOTE — Patient Instructions (Signed)
 Good to see you Enjoy llama and bad wine See me again in 2 months

## 2023-09-08 NOTE — Assessment & Plan Note (Signed)
 Patient has the spondylolisthesis with disc protrusion at L4-L5 that we will continue to monitor.  Has some tightness noted of the hip flexor today as well.  Responded well to osteopathic manipulation.  Continue to work on core strengthening.  Discussed icing regimen and home exercises otherwise.  Follow-up again in 6 to 8 weeks

## 2023-11-07 NOTE — Progress Notes (Signed)
 [name] [address] [place] [address] Phone: [phone] Subjective:   William [name], am serving as a scribe for Dr. Ronnell Coins.  I'm seeing this patient by the request  of:  Alejandro Hurt, FNP  CC: back and neck pain follow up   NUU:[id number]  William [name] is a 43 y.o. male coming in with complaint of back and neck pain. OMT 09/08/2023. Patient states that his neck and back are doing well.   L foot pain over the metatarsals for past 3 weeks. Pain is deep in his foot. Pressure on the foot makes it worse. Pain occurs when he is barefoot. Has been more active recently.   Medications patient has been prescribed: None  Taking:         Reviewed prior external information including notes and imaging from previsou exam, outside providers and external EMR if available.   As well as notes that were available from care everywhere and other healthcare systems.  Past medical history, social, surgical and family history all reviewed in electronic medical record.  No pertanent information unless stated regarding to the chief complaint.   Past Medical History:  Diagnosis Date   Eczema     Allergies  Allergen Reactions   Penicillins Anaphylaxis and Other (See Comments)     Review of Systems:  No headache, visual changes, nausea, vomiting, diarrhea, constipation, dizziness, abdominal pain, skin rash, fevers, chills, night sweats, weight loss, swollen lymph nodes, body aches, joint swelling, chest pain, shortness of breath, mood changes. POSITIVE muscle aches  Objective  There were no vitals taken for this visit.   General: No apparent distress alert and oriented x3 mood and affect normal, dressed appropriately.  HEENT: Pupils equal, extraocular movements intact  Respiratory: Patient's speak in full sentences and does not appear short of breath  Cardiovascular: No lower extremity edema, non tender, no erythema  Gait relatively  normal MSK:  Back  does have loss of lordosis positive FABER tightness on the left foot exam does have some breakdown of the transverse arch noted.  Patient does have bunion and bunionette formation noted. Foot exam shows breakdown of the transverse arch noted and is resedated.   Osteopathic findings  C2 flexed rotated and side bent right C6 flexed rotated and side bent left T3 extended rotated and side bent right inhaled rib T9 extended rotated and side bent left L2 flexed rotated and side bent right Sacrum left on left   [id number]; 15 additional minutes spent for Therapeutic exercises as stated in above notes.  This included exercises focusing on stretching, strengthening, with significant focus on eccentric aspects.   Long term goals include an improvement in range of motion, strength, endurance as well as avoiding reinjury. Patient's frequency would include in 1-2 times a day, 3-5 times a week for a duration of 6-12 weeks. Exercises for the foot include:  Stretches to help lengthen the lower leg and plantar fascia areas Theraband exercises for the lower leg and ankle to help strengthen the surrounding area- dorsiflexion, plantarflexion, inversion, eversion Massage rolling on the plantar surface of the foot with a frozen bottle, tennis ball or golf ball Towel or marble pick-ups to strengthen the plantar surface of the foot Weight bearing exercises to increase balance and overall stability   Proper technique shown and discussed handout in great detail with ATC.  All questions were discussed and answered.     Assessment and Plan:  No problem-specific Assessment & Plan  notes found for this encounter.    Nonallopathic problems  Decision today to treat with OMT was based on Physical Exam  After verbal consent patient was treated with HVLA, ME, FPR techniques in cervical, rib, thoracic, lumbar, and sacral  areas  Patient tolerated the procedure well with improvement in symptoms  Patient  given exercises, stretches and lifestyle modifications  See medications in patient instructions if given  Patient will follow up in 6-8 weeks    The above documentation has been reviewed and is accurate and complete William [name] [name], DO          Note: This dictation was prepared with Dragon dictation along with smaller phrase technology. Any transcriptional errors that result from this process are unintentional.

## 2023-11-08 ENCOUNTER — Encounter: Payer: Self-pay | Admitting: Family Medicine

## 2023-11-08 ENCOUNTER — Ambulatory Visit (INDEPENDENT_AMBULATORY_CARE_PROVIDER_SITE_OTHER): Admitting: Family Medicine

## 2023-11-08 VITALS — BP 110/82 | HR 55 | Ht 73.0 in | Wt 241.0 lb

## 2023-11-08 DIAGNOSIS — M545 Low back pain, unspecified: Secondary | ICD-10-CM | POA: Diagnosis not present

## 2023-11-08 DIAGNOSIS — M9904 Segmental and somatic dysfunction of sacral region: Secondary | ICD-10-CM | POA: Diagnosis not present

## 2023-11-08 DIAGNOSIS — M9908 Segmental and somatic dysfunction of rib cage: Secondary | ICD-10-CM | POA: Diagnosis not present

## 2023-11-08 DIAGNOSIS — M9903 Segmental and somatic dysfunction of lumbar region: Secondary | ICD-10-CM | POA: Diagnosis not present

## 2023-11-08 DIAGNOSIS — M216X9 Other acquired deformities of unspecified foot: Secondary | ICD-10-CM | POA: Diagnosis not present

## 2023-11-08 DIAGNOSIS — M9902 Segmental and somatic dysfunction of thoracic region: Secondary | ICD-10-CM | POA: Diagnosis not present

## 2023-11-08 DIAGNOSIS — G8929 Other chronic pain: Secondary | ICD-10-CM

## 2023-11-08 DIAGNOSIS — M9901 Segmental and somatic dysfunction of cervical region: Secondary | ICD-10-CM | POA: Diagnosis not present

## 2023-11-08 NOTE — Patient Instructions (Addendum)
 [employer]en[employer] Total Support Orthotics  Transverse arch exercises OOFOS recovery sandals in the house See me in 2-3 months

## 2023-11-08 NOTE — Assessment & Plan Note (Signed)
 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  New problem.  Discussed over-the-counter orthotics, home exercises given by athletic trainer, we will monitor.  May need custom orthotics in the long run.  Recovery sandals in the house encouraged.  Follow-up again in 6 to 8 weeks

## 2023-11-08 NOTE — Assessment & Plan Note (Signed)
 Chronic problem still noted.  Still attempting to get the impingement from his back.  Has the left lateral recess at the L4-L5 and L5 nerve root likely being impinged.  Doing relatively well though at the moment.  And decreasing in size on repeat imaging in 2024.  Will consider other injections if worsening but has responded to osteopathic manipulation after evaluation at each office visit.

## 2023-11-14 ENCOUNTER — Ambulatory Visit (INDEPENDENT_AMBULATORY_CARE_PROVIDER_SITE_OTHER): Admitting: Podiatry

## 2023-11-14 ENCOUNTER — Ambulatory Visit (INDEPENDENT_AMBULATORY_CARE_PROVIDER_SITE_OTHER)

## 2023-11-14 DIAGNOSIS — M7752 Other enthesopathy of left foot: Secondary | ICD-10-CM

## 2023-11-14 MED ORDER — MELOXICAM 15 MG PO TABS: 15.0000 mg | ORAL_TABLET | Freq: Every day | ORAL | 0 refills | Status: DC | PRN

## 2023-11-14 NOTE — Progress Notes (Signed)
"  °  Subjective:  Patient ID: William [name], male    DOB: December 01, 1980,  MRN: [id number]  Chief Complaint  Patient presents with   Foot Pain    Patient is here for left pain for last two months. Patient had a previous left ankle sprain.    Discussed the use of AI scribe software for clinical note transcription with the patient, who gave verbal consent to proceed.  History of Present Illness William [name] Will is a 43 year old male who presents with left foot pain. He was referred by another doctor for further evaluation as initial exercises.   He experiences pain in his left foot for the past couple of months, described as a sensation of 'stuff in the bones.' The pain is localized throughout the foot and worsens with certain movements but is absent at rest.  He has not sustained any injuries to the foot and has not used medications like ibuprofen or Tylenol for pain relief. Basic exercises recommended previously did not alleviate the pain.  He has a history of distance running and has noticed a callus in the center of his foot, which he attributes to pressure from running. There are no burning or tingling sensations in the toes. No numbness, tingling, or swelling in the left foot.      Objective:    Physical Exam General: AAO x3, NAD  Dermatological: Hyperkeratotic tissue noted submetatarsal 2 left foot without any underlying ulceration or drainage or any signs of infection.  Vascular: Dorsalis Pedis artery and Posterior Tibial artery pedal pulses are 2/4 bilateral with immedate capillary fill time.  There is no pain with calf compression, swelling, warmth, erythema.   Neruologic: Grossly intact via light touch bilateral.  Negative sign.  Musculoskeletal: There is no area pinpoint tenderness.  There is some tenderness on the interspace.  There is no palpable neuroma feel like more of a bursa along the interspace.  There is pain with medial lateral compression of the metatarsal  heads.  Gait: Unassisted, Nonantalgic.     No images are attached to the encounter.    Results RADIOLOGY Foot X-ray: Inflammation in the fourth metatarsal bone, no fracture, bones otherwise normal   Assessment:   1. Capsulitis of metatarsophalangeal (MTP) joint of left foot   2. Bursitis of intermetatarsal bursa of left foot      Plan:  Patient was evaluated and treated and all questions answered.  Assessment and Plan Assessment & Plan Left foot bursitis Chronic left foot pain localized to the metatarsal region suggests bursitis - Prescribe meloxicam  for inflammation. - Provide metatarsal pads to reduce pressure. - Instruct on toe exercises to increase space and stabilize foot structure. - Advise use of toe spacers. - Recommend supportive shoes with a wide toe box, avoid tight or leather shoes. - Instruct to avoid barefoot walking, use flat shoes with good support. - Advise follow-up if no improvement in 3-4 weeks.   Return if symptoms worsen or fail to improve.   [name]  "

## 2023-11-21 ENCOUNTER — Ambulatory Visit (INDEPENDENT_AMBULATORY_CARE_PROVIDER_SITE_OTHER): Admitting: Family Medicine

## 2023-11-21 VITALS — BP 119/72 | HR 65 | Ht 73.0 in | Wt 239.8 lb

## 2023-11-21 DIAGNOSIS — F419 Anxiety disorder, unspecified: Secondary | ICD-10-CM

## 2023-11-21 DIAGNOSIS — L2084 Intrinsic (allergic) eczema: Secondary | ICD-10-CM | POA: Insufficient documentation

## 2023-11-21 DIAGNOSIS — E66811 Obesity, class 1: Secondary | ICD-10-CM | POA: Insufficient documentation

## 2023-11-21 DIAGNOSIS — D2362 Other benign neoplasm of skin of left upper limb, including shoulder: Secondary | ICD-10-CM | POA: Insufficient documentation

## 2023-11-21 DIAGNOSIS — Z0000 Encounter for general adult medical examination without abnormal findings: Secondary | ICD-10-CM

## 2023-11-21 NOTE — Progress Notes (Signed)
 New Patient Office Visit  Subjective   Patient ID: William [name], male    DOB: 10-01-1980  Age: 43 y.o. MRN: [id number]  CC:  Chief Complaint  Patient presents with   New Patient (Initial Visit)    New Patient was seeing dr brake at [facility] physicians     HPI [name] presents to establish care Last PCP - Dr. Lorre Rosin  Weight concerns: Patient reports concerns related to weight.  He feels that over the past approximately 5 years she has gained about 30 pounds recently. Has been having various MSK issues which have also led to decrease in physical activity. Back has been affecting strength training and knee limiting aerobic activities.  Anxiety: Longstanding issue for patient.  Reports that symptoms are presently well-controlled with use of sertraline as well as alprazolam as needed.  He reports that he will typically utilize alprazolam once every couple weeks or so.  Does not need refill of medications today.  Medications were being managed by prior PCP.  Patient is originally from near [place], [place]; has lived here for about 15 years. Patient works as Art gallery manager at [employer] He enjoys golfing, hiking, sport shooting, yard work.  Outpatient Encounter Medications as of 11/21/2023  Medication Sig   ALPRAZolam (XANAX) 1 MG tablet 1 tablet   meloxicam  (MOBIC ) 15 MG tablet Take 1 tablet (15 mg total) by mouth daily as needed for pain.   sertraline (ZOLOFT) 100 MG tablet Take 1 tablet by mouth daily.   [DISCONTINUED] Vitamin D , Ergocalciferol , (DRISDOL ) 1.25 MG (50000 UNIT) CAPS capsule TAKE 1 CAPSULE (50,000 UNITS TOTAL) BY MOUTH EVERY 7 (SEVEN) DAYS   [DISCONTINUED] tiZANidine  (ZANAFLEX ) 2 MG tablet TAKE 1 TABLET BY MOUTH EVERYDAY AT BEDTIME (Patient not taking: Reported on 11/14/2023)   No facility-administered encounter medications on file as of 11/21/2023.    Past Medical History:  Diagnosis Date   Anxiety 2018   Take zoloft daily and xanax as needed   Eczema     Past  Surgical History:  Procedure Laterality Date   EYE SURGERY  07/2016   Lasik   MENISCUS REPAIR Left 2018    Family History  Problem Relation Age of Onset   Allergic rhinitis Mother    Allergic rhinitis Sister    Cancer Sister    Allergic rhinitis Sister    Cancer Father    Varicose Veins Father    Asthma Brother    Diabetes Paternal Uncle     Social History   Socioeconomic History   Marital status: Married    Spouse name: Not on file   Number of children: Not on file   Years of education: Not on file   Highest education level: Master's degree (e.g., MA, MS, MEng, MEd, MSW, MBA)  Occupational History   Not on file  Tobacco Use   Smoking status: Former    Passive exposure: Past   Smokeless tobacco: Never  Vaping Use   Vaping status: Never Used  Substance and Sexual Activity   Alcohol use: Yes    Alcohol/week: 2.0 standard drinks of alcohol    Types: 1 Glasses of wine, 1 Cans of beer per week   Drug use: Never   Sexual activity: Yes    Birth control/protection: None  Other Topics Concern   Not on file  Social History Narrative   ** Merged History Encounter **       Social Drivers of Health   Financial Resource Strain: Low Risk  (11/21/2023)  Overall Financial Resource Strain (CARDIA)    Difficulty of Paying Living Expenses: Not hard at all  Food Insecurity: No Food Insecurity (11/21/2023)   Hunger Vital Sign    Worried About Running Out of Food in the Last Year: Never true    Ran Out of Food in the Last Year: Never true  Transportation Needs: No Transportation Needs (11/21/2023)   PRAPARE - Administrator, Civil Service (Medical): No    Lack of Transportation (Non-Medical): No  Physical Activity: Insufficiently Active (11/21/2023)   Exercise Vital Sign    Days of Exercise per Week: 3 days    Minutes of Exercise per Session: 30 min  Stress: No Stress Concern Present (11/21/2023)   [employer] of Occupational Health - Occupational Stress  Questionnaire    Feeling of Stress : Only a little  Social Connections: Moderately Integrated (11/21/2023)   Social Connection and Isolation Panel [NHANES]    Frequency of Communication with Friends and Family: Twice a week    Frequency of Social Gatherings with Friends and Family: Twice a week    Attends Religious Services: 1 to 4 times per year    Active Member of [employer] or Organizations: No    Attends Engineer, structural: Not on file    Marital Status: Married  Intimate Partner Violence: Unknown (09/24/2021)   Received from [employer], [facility]   HITS    Physically Hurt: Not on file    Insult or Talk Down To: Not on file    Threaten Physical Harm: Not on file    Scream or Curse: Not on file    Objective   BP 119/72 (BP Location: Left Arm, Patient Position: Sitting, Cuff Size: Normal)   Pulse 65   Ht 6\' 1"  (1.854 m)   Wt 239 lb 12.8 oz (108.8 kg)   SpO2 99%   BMI 31.64 kg/m   Physical Exam  43 year old male in no acute distress Cardiovascular exam with regular rate and rhythm, no murmur appreciated Lungs clear to auscultation bilaterally  Assessment & Plan:   Anxiety Assessment & Plan: Can continue with current medication regimen.  PDMP reviewed, no red flags.  Does not need refills at this time.  Can refill medications when   Obesity, Class I, BMI 30-34.9 Assessment & Plan: BMI is elevated today with "obesity" range.  We can plan for labs with upcoming physical in order to further assess for any underlying thyroid dysfunction, blood sugar abnormalities with assessment of A1c.  We can further discuss weight management recommendations once labs reviewed   Wellness examination -     CBC with Differential/Platelet; Future -     Comprehensive metabolic panel with GFR; Future -     Hemoglobin A1c; Future -     Lipid panel; Future -     TSH Rfx on Abnormal to Free T4; Future  Return in about 6 weeks (around 01/02/2024) for CPE with fasting labs 1 week  prior.    ___________________________________________ Orange Hilligoss de [place], MD, ABFM, CAQSM Primary Care and Sports Medicine [facility]

## 2023-11-21 NOTE — Patient Instructions (Signed)
  Medication Instructions:  Your physician recommends that you continue on your current medications as directed. Please refer to the Current Medication list given to you today. --If you need a refill on any your medications before your next appointment, please call your pharmacy first. If no refills are authorized on file call the office.-- Lab Work: Your physician has recommended that you have lab work today: 1 week before next visit  If you have labs (blood work) drawn today and your tests are completely normal, you will receive your results via MyChart message OR a phone call from our staff.  Please ensure you check your voicemail in the event that you authorized detailed messages to be left on a delegated number. If you have any lab test that is abnormal or we need to change your treatment, we will call you to review the results.   Follow-Up: Your next appointment:   Your physician recommends that you schedule a follow-up appointment in: 1-2 months physical with Dr. de Peru  You will receive a text message or e-mail with a link to a survey about your care and experience with [place] today! We would greatly appreciate your feedback!   Thanks for letting us be apart of your health journey!!  Primary Care and Sports Medicine   Dr. Ceasar Mons Peru   We encourage you to activate your patient portal called "MyChart".  Sign up information is provided on this After Visit Summary.  MyChart is used to connect with patients for Virtual Visits (Telemedicine).  Patients are able to view lab/test results, encounter notes, upcoming appointments, etc.  Non-urgent messages can be sent to your provider as well. To learn more about what you can do with MyChart, please visit --  [email or web address].

## 2023-11-21 NOTE — Assessment & Plan Note (Signed)
 BMI is elevated today with "obesity" range.  We can plan for labs with upcoming physical in order to further assess for any underlying thyroid dysfunction, blood sugar abnormalities with assessment of A1c.  We can further discuss weight management recommendations once labs reviewed

## 2023-11-21 NOTE — Assessment & Plan Note (Signed)
 Can continue with current medication regimen.  PDMP reviewed, no red flags.  Does not need refills at this time.  Can refill medications when

## 2023-12-11 ENCOUNTER — Ambulatory Visit (INDEPENDENT_AMBULATORY_CARE_PROVIDER_SITE_OTHER): Admitting: Family Medicine

## 2023-12-11 ENCOUNTER — Other Ambulatory Visit (HOSPITAL_BASED_OUTPATIENT_CLINIC_OR_DEPARTMENT_OTHER): Payer: Self-pay | Admitting: *Deleted

## 2023-12-11 ENCOUNTER — Other Ambulatory Visit (HOSPITAL_COMMUNITY): Payer: Self-pay | Admitting: Family Medicine

## 2023-12-11 VITALS — BP 119/77 | HR 55 | Ht 73.0 in | Wt 241.4 lb

## 2023-12-11 DIAGNOSIS — Z0000 Encounter for general adult medical examination without abnormal findings: Secondary | ICD-10-CM

## 2023-12-11 LAB — CBC WITH DIFFERENTIAL/PLATELET
Basophils Absolute: 0 10*3/uL (ref 0.0–0.2)
Basos: 1 %
EOS (ABSOLUTE): 0.5 10*3/uL — ABNORMAL HIGH (ref 0.0–0.4)
Eos: 8 %
Hematocrit: 45.2 % (ref 37.5–51.0)
Hemoglobin: 14.1 g/dL (ref 13.0–17.7)
Immature Grans (Abs): 0 10*3/uL (ref 0.0–0.1)
Immature Granulocytes: 0 %
Lymphocytes Absolute: 2.1 10*3/uL (ref 0.7–3.1)
Lymphs: 33 %
MCH: 29.3 pg (ref 26.6–33.0)
MCHC: 31.2 g/dL — ABNORMAL LOW (ref 31.5–35.7)
MCV: 94 fL (ref 79–97)
Monocytes Absolute: 0.6 10*3/uL (ref 0.1–0.9)
Monocytes: 9 %
Neutrophils Absolute: 3.1 10*3/uL (ref 1.4–7.0)
Neutrophils: 49 %
Platelets: 168 10*3/uL (ref 150–450)
RBC: 4.81 x10E6/uL (ref 4.14–5.80)
RDW: 12 % (ref 11.6–15.4)
WBC: 6.4 10*3/uL (ref 3.4–10.8)

## 2023-12-11 LAB — LIPID PANEL
Chol/HDL Ratio: 3.7 ratio (ref 0.0–5.0)
Cholesterol, Total: 173 mg/dL (ref 100–199)
HDL: 47 mg/dL (ref >39)
LDL Chol Calc (NIH): 113 mg/dL — ABNORMAL HIGH (ref 0–99)
Triglycerides: 65 mg/dL (ref 0–149)
VLDL Cholesterol Cal: 13 mg/dL (ref 5–40)

## 2023-12-11 LAB — COMPREHENSIVE METABOLIC PANEL WITH GFR
ALT: 15 IU/L (ref 0–44)
AST: 20 IU/L (ref 0–40)
Albumin: 4.5 g/dL (ref 4.1–5.1)
Alkaline Phosphatase: 55 IU/L (ref 44–121)
BUN/Creatinine Ratio: 20 (ref 9–20)
BUN: 17 mg/dL (ref 6–24)
Bilirubin Total: 0.3 mg/dL (ref 0.0–1.2)
CO2: 24 mmol/L (ref 20–29)
Calcium: 9.5 mg/dL (ref 8.7–10.2)
Chloride: 102 mmol/L (ref 96–106)
Creatinine, Ser: 0.84 mg/dL (ref 0.76–1.27)
Globulin, Total: 1.8 g/dL (ref 1.5–4.5)
Glucose: 90 mg/dL (ref 70–99)
Potassium: 4.7 mmol/L (ref 3.5–5.2)
Sodium: 141 mmol/L (ref 134–144)
Total Protein: 6.3 g/dL (ref 6.0–8.5)
eGFR: 112 mL/min/{1.73_m2} (ref >59)

## 2023-12-11 LAB — HEMOGLOBIN A1C
Est. average glucose Bld gHb Est-mCnc: 103 mg/dL
Hgb A1c MFr Bld: 5.2 % (ref 4.8–5.6)

## 2023-12-11 LAB — TSH RFX ON ABNORMAL TO FREE T4: TSH: 0.923 u[IU]/mL (ref 0.450–4.500)

## 2023-12-11 NOTE — Patient Instructions (Signed)
   Medication Instructions:  Your physician recommends that you continue on your current medications as directed. Please refer to the Current Medication list given to you today. --If you need a refill on any your medications before your next appointment, please call your pharmacy first. If no refills are authorized on file call the office.--   Follow-Up: Your next appointment:   Your physician recommends that you schedule a follow-up appointment in: 1 year physical with Dr. de Peru  You will receive a text message or e-mail with a link to a survey about your care and experience with [place] today! We would greatly appreciate your feedback!   Thanks for letting us be apart of your health journey!!  Primary Care and Sports Medicine   Dr. Ceasar Mons [place]   We encourage you to activate your patient portal called "MyChart".  Sign up information is provided on this After Visit Summary.  MyChart is used to connect with patients for Virtual Visits (Telemedicine).  Patients are able to view lab/test results, encounter notes, upcoming appointments, etc.  Non-urgent messages can be sent to your provider as well. To learn more about what you can do with MyChart, please visit --  [email or web address].

## 2023-12-11 NOTE — Progress Notes (Signed)
 Subjective:    CC: Annual Physical Exam  HPI: William [name] is a 43 y.o. presenting for annual physical  I reviewed the past medical history, family history, social history, surgical history, and allergies today and no changes were needed.  Please see the problem list section below in epic for further details.  Past Medical History: Past Medical History:  Diagnosis Date   Anxiety 2018   Take zoloft daily and xanax as needed   Eczema    Past Surgical History: Past Surgical History:  Procedure Laterality Date   EYE SURGERY  07/2016   Lasik   MENISCUS REPAIR Left 2018   Social History: Social History   Socioeconomic History   Marital status: Married    Spouse name: Not on file   Number of children: Not on file   Years of education: Not on file   Highest education level: Master's degree (e.g., MA, MS, MEng, MEd, MSW, MBA)  Occupational History   Not on file  Tobacco Use   Smoking status: Former    Passive exposure: Past   Smokeless tobacco: Never  Vaping Use   Vaping status: Never Used  Substance and Sexual Activity   Alcohol use: Yes    Alcohol/week: 2.0 standard drinks of alcohol    Types: 1 Glasses of wine, 1 Cans of beer per week   Drug use: Never   Sexual activity: Yes    Birth control/protection: None  Other Topics Concern   Not on file  Social History Narrative   ** Merged History Encounter **       Social Drivers of Health   Financial Resource Strain: Low Risk  (12/11/2023)   Overall Financial Resource Strain (CARDIA)    Difficulty of Paying Living Expenses: Not hard at all  Food Insecurity: No Food Insecurity (12/11/2023)   Hunger Vital Sign    Worried About Running Out of Food in the Last Year: Never true    Ran Out of Food in the Last Year: Never true  Transportation Needs: No Transportation Needs (12/11/2023)   PRAPARE - Administrator, Civil Service (Medical): No    Lack of Transportation (Non-Medical): No  Physical Activity:  Insufficiently Active (12/11/2023)   Exercise Vital Sign    Days of Exercise per Week: 3 days    Minutes of Exercise per Session: 30 min  Stress: No Stress Concern Present (12/11/2023)   [employer] of Occupational Health - Occupational Stress Questionnaire    Feeling of Stress: Only a little  Social Connections: Moderately Integrated (12/11/2023)   Social Connection and Isolation Panel    Frequency of Communication with Friends and Family: Twice a week    Frequency of Social Gatherings with Friends and Family: Twice a week    Attends Religious Services: 1 to 4 times per year    Active Member of [employer] or Organizations: No    Attends Engineer, structural: Not on file    Marital Status: Married   Family History: Family History  Problem Relation Age of Onset   Allergic rhinitis Mother    Allergic rhinitis Sister    Cancer Sister    Allergic rhinitis Sister    Cancer Father    Varicose Veins Father    Asthma Brother    Diabetes Paternal Uncle    Allergies: No Active Allergies Medications: See med rec.  Review of Systems: No headache, visual changes, nausea, vomiting, diarrhea, constipation, dizziness, abdominal pain, skin rash, fevers, chills, night sweats, swollen lymph  nodes, weight loss, chest pain, body aches, joint swelling, muscle aches, shortness of breath, mood changes, visual or auditory hallucinations.  Objective:    BP 119/77 (BP Location: Left Arm, Patient Position: Sitting, Cuff Size: Normal)   Pulse (!) 55   Ht 6' 1 (1.854 m)   Wt 241 lb 6.4 oz (109.5 kg)   SpO2 99%   BMI 31.85 kg/m   General: Well Developed, well nourished, and in no acute distress. Neuro: Alert and oriented x3, extra-ocular muscles intact, sensation grossly intact. Cranial nerves II through XII are intact, motor, sensory, and coordinative functions are all intact. HEENT: Normocephalic, atraumatic, pupils equal round reactive to light, neck supple, no masses, no lymphadenopathy,  thyroid nonpalpable. Oropharynx, nasopharynx, external ear canals are unremarkable. Skin: Warm and dry, no rashes noted. Cardiac: Regular rate and rhythm, no murmurs rubs or gallops. Respiratory: Clear to auscultation bilaterally. Not using accessory muscles, speaking in full sentences. Abdominal: Soft, nontender, nondistended, positive bowel sounds, no masses, no organomegaly. Musculoskeletal: Shoulder, elbow, wrist, hip, knee, ankle stable, and with full range of motion.  Impression and Recommendations:    Wellness examination Assessment & Plan: Routine HCM labs ordered. HCM reviewed/discussed. Anticipatory guidance regarding healthy weight, lifestyle and choices given. Recommend healthy diet.  Recommend approximately 150 minutes/week of moderate intensity exercise Recommend regular dental and vision exams Always use seatbelt/lap and shoulder restraints Recommend using smoke alarms and checking batteries at least twice a year Recommend using sunscreen when outside Discussed immunization recommendations   Has been working on regular physical activity. Thinks he could make more improvement from a dietary standpoint.   Return in about 1 year (around 12/10/2024) for CPE.   ___________________________________________ Dang Mathison de [place], MD, ABFM, [facility]

## 2023-12-11 NOTE — Assessment & Plan Note (Signed)

## 2023-12-25 ENCOUNTER — Encounter (HOSPITAL_BASED_OUTPATIENT_CLINIC_OR_DEPARTMENT_OTHER): Payer: Self-pay | Admitting: Family Medicine

## 2024-01-02 ENCOUNTER — Ambulatory Visit (HOSPITAL_BASED_OUTPATIENT_CLINIC_OR_DEPARTMENT_OTHER): Payer: Self-pay | Admitting: Family Medicine

## 2024-01-02 DIAGNOSIS — D721 Eosinophilia, unspecified: Secondary | ICD-10-CM

## 2024-01-04 NOTE — Progress Notes (Signed)
 [name] [address] [place] [address] Phone: [phone] Subjective:   William [name], am serving as a scribe for Dr. Arthea Claudene.  I'm seeing this patient by the request  of:  de [place], William PARAS, MD  CC: back and neck pain follow up   YEP:Dlagzrupcz  William [name] is a 43 y.o. male coming in with complaint of back and neck pain. OMT 11/08/2023. Patient states that he is doing alright. No new issues. Pain is present all the time with and without supportive footwear. Pain in metatarsal heads. Saw podiatry.   Continues to have pain in his foot. Exercises are helping   Medications patient has been prescribed: None  Taking:         Reviewed prior external information including notes and imaging from previsou exam, outside providers and external EMR if available.   As well as notes that were available from care everywhere and other healthcare systems.  Past medical history, social, surgical and family history all reviewed in electronic medical record.  No pertanent information unless stated regarding to the chief complaint.   Past Medical History:  Diagnosis Date   Anxiety 2018   Take zoloft daily and xanax as needed   Eczema     No Active Allergies   Review of Systems:  No headache, visual changes, nausea, vomiting, diarrhea, constipation, dizziness, abdominal pain, skin rash, fevers, chills, night sweats, weight loss, swollen lymph nodes, body aches, joint swelling, chest pain, shortness of breath, mood changes. POSITIVE muscle aches  Objective  Blood pressure 118/78, pulse (!) 58, height 6' 1 (1.854 m), weight 245 lb (111.1 kg), SpO2 98%.   General: No apparent distress alert and oriented x3 mood and affect normal, dressed appropriately.  HEENT: Pupils equal, extraocular movements intact  Respiratory: Patient's speak in full sentences and does not appear short of breath  Cardiovascular: No lower extremity edema, non tender,  no erythema  Gait MSK:  Back does have tightness noted in the paraspinal musculature.  The L4-L5 still has a mild step-off noted.  Patient does have tightness with FABER test noted.  Osteopathic findings  C2 flexed rotated and side bent right C6 flexed rotated and side bent left T3 extended rotated and side bent right inhaled rib T9 extended rotated and side bent left L2 flexed rotated and side bent right L5 flexed rotated and side bent left Sacrum right on right       Assessment and Plan:  Low back pain Continues to have some difficulty.  Discussed icing regimen and home exercises.  Discussed which activities to do and which ones to avoid.  Tightness with Deri right greater than left.  Open patient will continue with the same medications at the moment.  Will increase activity slowly.  Will follow-up again in 6 to 8 weeks.    Nonallopathic problems  Decision today to treat with OMT was based on Physical Exam  After verbal consent patient was treated with HVLA, ME, FPR techniques in cervical, rib, thoracic, lumbar, and sacral  areas  Patient tolerated the procedure well with improvement in symptoms  Patient given exercises, stretches and lifestyle modifications  See medications in patient instructions if given  Patient will follow up in 4-8 weeks     The above documentation has been reviewed and is accurate and complete William [name] M William Bochicchio, DO         Note: This dictation was prepared with Dragon dictation along with smaller phrase technology.  Any transcriptional errors that result from this process are unintentional.

## 2024-01-09 ENCOUNTER — Ambulatory Visit (INDEPENDENT_AMBULATORY_CARE_PROVIDER_SITE_OTHER): Admitting: Family Medicine

## 2024-01-09 VITALS — BP 118/78 | HR 58 | Ht 73.0 in | Wt 245.0 lb

## 2024-01-09 DIAGNOSIS — G8929 Other chronic pain: Secondary | ICD-10-CM

## 2024-01-09 DIAGNOSIS — M9902 Segmental and somatic dysfunction of thoracic region: Secondary | ICD-10-CM

## 2024-01-09 DIAGNOSIS — M9908 Segmental and somatic dysfunction of rib cage: Secondary | ICD-10-CM

## 2024-01-09 DIAGNOSIS — M9901 Segmental and somatic dysfunction of cervical region: Secondary | ICD-10-CM | POA: Diagnosis not present

## 2024-01-09 DIAGNOSIS — M9903 Segmental and somatic dysfunction of lumbar region: Secondary | ICD-10-CM | POA: Diagnosis not present

## 2024-01-09 DIAGNOSIS — M545 Low back pain, unspecified: Secondary | ICD-10-CM

## 2024-01-09 DIAGNOSIS — M9904 Segmental and somatic dysfunction of sacral region: Secondary | ICD-10-CM

## 2024-01-09 NOTE — Patient Instructions (Signed)
 Monitor feet See me in 2-3 months

## 2024-01-09 NOTE — Assessment & Plan Note (Signed)
 Continues to have some difficulty.  Discussed icing regimen and home exercises.  Discussed which activities to do and which ones to avoid.  Tightness with Deri right greater than left.  Open patient will continue with the same medications at the moment.  Will increase activity slowly.  Will follow-up again in 6 to 8 weeks.

## 2024-02-28 ENCOUNTER — Encounter (HOSPITAL_BASED_OUTPATIENT_CLINIC_OR_DEPARTMENT_OTHER): Admitting: Family Medicine

## 2024-03-05 ENCOUNTER — Encounter (HOSPITAL_BASED_OUTPATIENT_CLINIC_OR_DEPARTMENT_OTHER): Payer: Self-pay | Admitting: Family Medicine

## 2024-03-05 DIAGNOSIS — J338 Other polyp of sinus: Secondary | ICD-10-CM

## 2024-03-07 NOTE — Telephone Encounter (Signed)
 Copied from CRM [id number]. Topic: Referral - Request for Referral >> Mar 07, 2024  8:43 AM [place] MATSU wrote: Did the patient discuss referral with their provider in the last year? No (If No - schedule appointment) (If Yes - send message)  Appointment offered? Yes but patient was seen on 12/11/23  Type of order/referral and detailed reason for visit: ENT ; polyp wasf ound when x-rays for dental and now patient is requesting to see an ENT   Preference of office, provider, location: [facility] on [address]   If referral order, have you been seen by this specialty before? No (If Yes, this issue or another issue? When? Where?  Can we respond through MyChart? Yes

## 2024-03-07 NOTE — Telephone Encounter (Signed)
 Mychart already sent by pt. Pt now called the office about referral. Please advise.

## 2024-03-08 NOTE — Progress Notes (Unsigned)
 [name] [address] [place] [address] Phone: [phone] Subjective:   William [name], am serving as a scribe for Dr. Arthea Claudene.  I'm seeing this patient by the request  of:  de [place], Quintin PARAS, MD  CC: back and neck pain   YEP:Dlagzrupcz  William [name] is a 43 y.o. male coming in with complaint of back and neck pain. OMT 01/09/2024. Patient states that he continues to have pain over the metatarsal heads in both feet. L>R. No pattern to his pain. Having zero drop shoes and less cushion seems to be more comfortable.   Medications patient has been prescribed: None  Taking:         Reviewed prior external information including notes and imaging from previsou exam, outside providers and external EMR if available.   As well as notes that were available from care everywhere and other healthcare systems.  Past medical history, social, surgical and family history all reviewed in electronic medical record.  No pertanent information unless stated regarding to the chief complaint.   Past Medical History:  Diagnosis Date   Anxiety 2018   Take zoloft daily and xanax as needed   Eczema     No Active Allergies   Review of Systems:  No headache, visual changes, nausea, vomiting, diarrhea, constipation, dizziness, abdominal pain, skin rash, fevers, chills, night sweats, weight loss, swollen lymph nodes, body aches, joint swelling, chest pain, shortness of breath, mood changes. POSITIVE muscle aches  Objective  Blood pressure 122/82, pulse 76, height 6' 1 (1.854 m), weight 246 lb (111.6 kg), SpO2 98%.   General: No apparent distress alert and oriented x3 mood and affect normal, dressed appropriately.  HEENT: Pupils equal, extraocular movements intact  Respiratory: Patient's speak in full sentences and does not appear short of breath  Cardiovascular: No lower extremity edema, non tender, no erythema  Gait MSK:  Back does have some loss  lordosis noted.  Some tenderness to palpation of the paraspinal musculature. Patient does have tenderness of the foot.  Positive squeeze test noted.  Breakdown of the transverse arch noted.  Breakdown of the right foot noted as well with mild positive compression and pain as well as the forefoot  Limited muscular skeletal ultrasound was performed and interpreted by CLAUDENE ARTHEA, M  Limited ultrasound shows the patient does have hypoechoic changes of the first MTP large neuroma noted between the 1st and 2nd toes on the left foot.  Consistent with a very large neuroma no other areas of findings that are concerning.   Procedure: Real-time Ultrasound Guided Injection of left foot neuroma Device: GE Logiq Q7 Ultrasound guided injection is preferred based studies that show increased duration, increased effect, greater accuracy, decreased procedural pain, increased response rate, and decreased cost with ultrasound guided versus blind injection.  Verbal informed consent obtained.  Time-out conducted.  Noted no overlying erythema, induration, or other signs of local infection.  Skin prepped in a sterile fashion.  Local anesthesia: Topical Ethyl chloride.  With sterile technique and under real time ultrasound guidance: With a 25-gauge half inch needle injected with 0.5 cc of 0.5% Marcaine and 0.5 cc of Kenalog 40 mg/mL Completed without difficulty  Pain immediately resolved suggesting accurate placement of the medication.  Advised to call if fevers/chills, erythema, induration, drainage, or persistent bleeding.  Impression: Technically successful ultrasound guided injection.   Assessment and Plan:  Loss of transverse plantar arch Breakdown of the transverse arch noted and does have  a wound noted to the foot.  Discussed icing regimen and home exercises, discussed which activities to do and which ones to avoid.  Increase activity slowly.  Discussed icing regimen.  Follow-up again in 6 to 8 weeks  otherwise.  Interdigital neuroma of left foot Patient given injection and tolerated the procedure well, discussed icing regimen and home exercises.  Has 1 on the contralateral side that we will need to monitor as well.  Increase activity slowly.  Discussed icing regimen over-the-counter to Idexx, follow-up again in 6 to 8 weeks if continuing to have pain will need to consider the possibility of MRI.  X-ray showed no significant bony abnormality       The above documentation has been reviewed and is accurate and complete Arthea CHRISTELLA Sharps, DO          Note: This dictation was prepared with Dragon dictation along with smaller phrase technology. Any transcriptional errors that result from this process are unintentional.

## 2024-03-11 ENCOUNTER — Ambulatory Visit: Admitting: Family Medicine

## 2024-03-11 ENCOUNTER — Encounter: Payer: Self-pay | Admitting: Family Medicine

## 2024-03-11 ENCOUNTER — Other Ambulatory Visit: Payer: Self-pay

## 2024-03-11 ENCOUNTER — Ambulatory Visit (INDEPENDENT_AMBULATORY_CARE_PROVIDER_SITE_OTHER)

## 2024-03-11 VITALS — BP 122/82 | HR 76 | Ht 73.0 in | Wt 246.0 lb

## 2024-03-11 DIAGNOSIS — G5782 Other specified mononeuropathies of left lower limb: Secondary | ICD-10-CM

## 2024-03-11 DIAGNOSIS — M79671 Pain in right foot: Secondary | ICD-10-CM

## 2024-03-11 DIAGNOSIS — M79672 Pain in left foot: Secondary | ICD-10-CM | POA: Diagnosis not present

## 2024-03-11 DIAGNOSIS — M216X9 Other acquired deformities of unspecified foot: Secondary | ICD-10-CM

## 2024-03-11 NOTE — Assessment & Plan Note (Signed)
 Breakdown of the transverse arch noted and does have a wound noted to the foot.  Discussed icing regimen and home exercises, discussed which activities to do and which ones to avoid.  Increase activity slowly.  Discussed icing regimen.  Follow-up again in 6 to 8 weeks otherwise.

## 2024-03-11 NOTE — Patient Instructions (Signed)
 Spenco Total Support Original Orthotics Arch ex Injected L foot today See me again in 2 months

## 2024-03-11 NOTE — Assessment & Plan Note (Addendum)
 Patient given injection and tolerated the procedure well, discussed icing regimen and home exercises.  Has 1 on the contralateral side that we will need to monitor as well.  Increase activity slowly.  Discussed icing regimen over-the-counter to Idexx, follow-up again in 6 to 8 weeks if continuing to have pain will need to consider the possibility of MRI.  X-ray showed no significant bony abnormality

## 2024-03-15 ENCOUNTER — Ambulatory Visit (INDEPENDENT_AMBULATORY_CARE_PROVIDER_SITE_OTHER): Admitting: Otolaryngology

## 2024-03-15 ENCOUNTER — Encounter (INDEPENDENT_AMBULATORY_CARE_PROVIDER_SITE_OTHER): Payer: Self-pay | Admitting: Otolaryngology

## 2024-03-15 VITALS — BP 108/73 | HR 56 | Temp 98.8°F | Ht 73.0 in | Wt 246.0 lb

## 2024-03-15 DIAGNOSIS — R0981 Nasal congestion: Secondary | ICD-10-CM | POA: Diagnosis not present

## 2024-03-15 DIAGNOSIS — J342 Deviated nasal septum: Secondary | ICD-10-CM | POA: Diagnosis not present

## 2024-03-15 DIAGNOSIS — Z87891 Personal history of nicotine dependence: Secondary | ICD-10-CM | POA: Diagnosis not present

## 2024-03-15 DIAGNOSIS — J343 Hypertrophy of nasal turbinates: Secondary | ICD-10-CM | POA: Diagnosis not present

## 2024-03-15 DIAGNOSIS — J310 Chronic rhinitis: Secondary | ICD-10-CM

## 2024-03-15 DIAGNOSIS — J324 Chronic pansinusitis: Secondary | ICD-10-CM

## 2024-03-17 DIAGNOSIS — J342 Deviated nasal septum: Secondary | ICD-10-CM | POA: Insufficient documentation

## 2024-03-17 DIAGNOSIS — J324 Chronic pansinusitis: Secondary | ICD-10-CM | POA: Insufficient documentation

## 2024-03-17 DIAGNOSIS — J343 Hypertrophy of nasal turbinates: Secondary | ICD-10-CM | POA: Insufficient documentation

## 2024-03-17 NOTE — Progress Notes (Signed)
 CC: Recurrent sinusitis, chronic nasal obstruction  HPI:  William [name] is a 43 y.o. male who presents today complaining of recurrent sinusitis and chronic nasal obstruction.  The patient has been symptomatic for more than 5 years.  The nasal obstruction is worse on the left side.  His symptoms are worse during the allergy seasons.  He was recently noted to have sinus polyps on his dental x-ray.  The patient was previously evaluated by an allergist.  He uses Claritin and Flonase as needed.  He has no previous ENT surgery.  He has no recent sinus CT scan.  Past Medical History:  Diagnosis Date   Anxiety 2018   Take zoloft daily and xanax as needed   Eczema     Past Surgical History:  Procedure Laterality Date   EYE SURGERY  07/2016   Lasik   MENISCUS REPAIR Left 2018    Family History  Problem Relation Age of Onset   Allergic rhinitis Mother    Allergic rhinitis Sister    Cancer Sister    Allergic rhinitis Sister    Cancer Father    Varicose Veins Father    Asthma Brother    Diabetes Paternal Uncle     Social History:  reports that he has quit smoking. He has been exposed to tobacco smoke. He has never used smokeless tobacco. He reports current alcohol use of about 2.0 standard drinks of alcohol per week. He reports that he does not use drugs.  Allergies: No Active Allergies  Prior to Admission medications   Medication Sig Start Date End Date Taking? Authorizing Provider  ALPRAZolam (XANAX) 1 MG tablet 1 tablet   Yes [provider]  sertraline (ZOLOFT) 100 MG tablet Take 1 tablet by mouth daily. 11/19/22  Yes [provider]    Blood pressure 108/73, pulse (!) 56, temperature 98.8 F (37.1 C), height 6' 1 (1.854 m), weight 246 lb (111.6 kg), SpO2 96%. Exam: General: Communicates without difficulty, well nourished, no acute distress. Head: Normocephalic, no evidence injury, no tenderness, facial buttresses intact without stepoff. Face/sinus: No tenderness to  palpation and percussion. Facial movement is normal and symmetric. Eyes: PERRL, EOMI. No scleral icterus, conjunctivae clear. Neuro: CN II exam reveals vision grossly intact.  No nystagmus at any point of gaze. Ears: Auricles well formed without lesions.  Ear canals are intact without mass or lesion.  No erythema or edema is appreciated.  The TMs are intact without fluid. Nose: External evaluation reveals normal support and skin without lesions.  Dorsum is intact.  Anterior rhinoscopy reveals congested mucosa over anterior aspect of inferior turbinates and deviated septum.  No purulence noted. Oral:  Oral cavity and oropharynx are intact, symmetric, without erythema or edema.  Mucosa is moist without lesions. Neck: Full range of motion without pain.  There is no significant lymphadenopathy.  No masses palpable.  Thyroid bed within normal limits to palpation.  Parotid glands and submandibular glands equal bilaterally without mass.  Trachea is midline. Neuro:  CN 2-12 grossly intact.   Procedure:  Flexible Nasal Endoscopy: Description: Risks, benefits, and alternatives of flexible endoscopy were explained to the patient.  Specific mention was made of the risk of throat numbness with difficulty swallowing, possible bleeding from the nose and mouth, and pain from the procedure.  The patient gave oral consent to proceed.  The flexible scope was inserted into the right nasal cavity.  Endoscopy of the interior nasal cavity, superior, inferior, and middle meatus was performed. The sphenoid-ethmoid  recess was examined. Edematous mucosa was noted.  No polyp, mass, or lesion was appreciated. Nasal septal deviation noted. Olfactory cleft was clear.  Nasopharynx was clear.  Turbinates were hypertrophied but without mass.  The procedure was repeated on the contralateral side with similar findings.  The patient tolerated the procedure well.   Assessment: 1.  Chronic rhinitis with nasal mucosal congestion, nasal septal  deviation, and bilateral inferior turbinate hypertrophy. 2.  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  However, no acute infection is noted today.  No purulent drainage is noted on today's nasal endoscopy examination.  Plan: 1.  The physical exam and nasal endoscopy findings are reviewed with the patient. 2.  Flonase nasal spray 2 sprays each nostril daily.  The importance of consistent daily use is discussed. 3.  Sinus CT scan to evaluate for chronic sinusitis. 4.  The patient will return for reevaluation in 4 weeks.  Rifka Ramey W Kimmora Risenhoover 03/17/2024, 5:20 PM

## 2024-03-27 NOTE — Progress Notes (Unsigned)
 [name] [address] [place] [address] Phone: [phone] Subjective:   William [name], am serving as a scribe for Dr. Arthea Claudene.  I'm seeing this patient by the request  of:  [place], Quintin PARAS, MD  CC: Low back pain and foot pain  YEP:Dlagzrupcz  03/11/2024 Patient given injection and tolerated the procedure well, discussed icing regimen and home exercises.  Has 1 on the contralateral side that we will need to monitor as well.  Increase activity slowly.  Discussed icing regimen over-the-counter to Idexx, follow-up again in 6 to 8 weeks if continuing to have pain will need to consider the possibility of MRI.  X-ray showed no significant bony abnormality      Update 04/01/2024 William [name] is a 43 y.o. male coming in with complaint of L foot and LBP. Patient states that his back is doing well. Did a lot of yardwork this weekend is sore.   Foot is better. Does bother him sometimes and it gets stiff.   - Last exam did 2 injections for a neuroma in the left foot.  This was September 22.     Past Medical History:  Diagnosis Date   Anxiety 2018   Take zoloft daily and xanax as needed   Eczema    Past Surgical History:  Procedure Laterality Date   EYE SURGERY  07/2016   Lasik   MENISCUS REPAIR Left 2018   Social History   Socioeconomic History   Marital status: Married    Spouse name: Not on file   Number of children: Not on file   Years of education: Not on file   Highest education level: Master's degree (e.g., MA, MS, MEng, MEd, MSW, MBA)  Occupational History   Not on file  Tobacco Use   Smoking status: Former    Passive exposure: Past   Smokeless tobacco: Never  Vaping Use   Vaping status: Never Used  Substance and Sexual Activity   Alcohol use: Yes    Alcohol/week: 2.0 standard drinks of alcohol    Types: 1 Glasses of wine, 1 Cans of beer per week   Drug use: Never   Sexual activity: Yes    Birth  control/protection: None  Other Topics Concern   Not on file  Social History Narrative   ** Merged History Encounter **       Social Drivers of Health   Financial Resource Strain: Low Risk  (12/11/2023)   Overall Financial Resource Strain (CARDIA)    Difficulty of Paying Living Expenses: Not hard at all  Food Insecurity: No Food Insecurity (12/11/2023)   Hunger Vital Sign    Worried About Running Out of Food in the Last Year: Never true    Ran Out of Food in the Last Year: Never true  Transportation Needs: No Transportation Needs (12/11/2023)   PRAPARE - Administrator, Civil Service (Medical): No    Lack of Transportation (Non-Medical): No  Physical Activity: Insufficiently Active (12/11/2023)   Exercise Vital Sign    Days of Exercise per Week: 3 days    Minutes of Exercise per Session: 30 min  Stress: No Stress Concern Present (12/11/2023)   [employer] of Occupational Health - Occupational Stress Questionnaire    Feeling of Stress: Only a little  Social Connections: Moderately Integrated (12/11/2023)   Social Connection and Isolation Panel    Frequency of Communication with Friends and Family: Twice a week    Frequency  of Social Gatherings with Friends and Family: Twice a week    Attends Religious Services: 1 to 4 times per year    Active Member of [employer] or Organizations: No    Attends Engineer, structural: Not on file    Marital Status: Married   No Active Allergies Family History  Problem Relation Age of Onset   Allergic rhinitis Mother    Allergic rhinitis Sister    Cancer Sister    Allergic rhinitis Sister    Cancer Father    Varicose Veins Father    Asthma Brother    Diabetes Paternal Uncle          Current Outpatient Medications (Other):    ALPRAZolam (XANAX) 1 MG tablet, 1 tablet   sertraline (ZOLOFT) 100 MG tablet, Take 1 tablet by mouth daily.   Reviewed prior external information including notes and imaging from  primary  care provider As well as notes that were available from care everywhere and other healthcare systems.  Past medical history, social, surgical and family history all reviewed in electronic medical record.  No pertanent information unless stated regarding to the chief complaint.   Review of Systems:  No headache, visual changes, nausea, vomiting, diarrhea, constipation, dizziness, abdominal pain, skin rash, fevers, chills, night sweats, weight loss, swollen lymph nodes, body aches, joint swelling, chest pain, shortness of breath, mood changes. POSITIVE muscle aches  Objective  Blood pressure 118/82, pulse (!) 57, height 6' 1 (1.854 m), weight 245 lb (111.1 kg), SpO2 97%.   General: No apparent distress alert and oriented x3 mood and affect normal, dressed appropriately.  HEENT: Pupils equal, extraocular movements intact  Respiratory: Patient's speak in full sentences and does not appear short of breath  Cardiovascular: No lower extremity edema, non tender, no erythema  Low back does have some loss lordosis.  Tenderness to palpation more on the left side of the low back.  Worsening pain with extension.  No midline tenderness.  Foot exam shows breakdown of the transverse arch but patient has a feeling of discomfort with compression but no true pain.  Limited muscular skeletal ultrasound was performed and interpreted by CLAUDENE HUSSAR, M  Limited ultrasound shows some very mild hypoechoic changes but decrease in the severity from previous exam.  Osteopathic findings C6 flexed rotated and side bent left T3 extended rotated and side bent right inhaled third rib L2 flexed rotated and side bent right L3 flexed rotated and side bent left Sacrum left on left   Limited muscular skeletal ultrasound was performed and interpreted by CLAUDENE HUSSAR, M  Limited ultrasound shows some hypoechoic changes noted.  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  We discussed the possibility of different treatment options including a potential microdiscectomy which patient would like to not do.  We discussed different medications and with patient on sertraline potentially consider the change to either Cymbalta or Effexor could be beneficial for this individual.  Discussed icing regimen and home exercises, continue to work on core strengthening.  Patient declined any type of referral to a neurosurgeon to discuss any surgical options.  Follow-up with me again in 6 to 8 weeks  Interdigital neuroma of left foot Significant improvement after the injection.  Discussed icing regimen and home exercises, discussed which activities to do and which ones to avoid.  Increase activity slowly.  Follow-up again in 6 to 8 weeks.     Decision today to treat with OMT was based on Physical Exam  After verbal consent patient was treated with HVLA, ME, FPR techniques in cervical, thoracic, rib, lumbar and sacral areas, all areas are chronic   Patient tolerated the procedure well with improvement in symptoms  Patient given exercises, stretches and lifestyle modifications  See medications in patient instructions if given  Patient will follow up in 4-8 weeks   The above documentation has been reviewed and is accurate and complete Sanders Manninen M Jolisa Intriago, DO

## 2024-04-01 ENCOUNTER — Ambulatory Visit (HOSPITAL_BASED_OUTPATIENT_CLINIC_OR_DEPARTMENT_OTHER)
Admission: RE | Admit: 2024-04-01 | Discharge: 2024-04-01 | Disposition: A | Discharge status: Home / Self Care | Source: Ambulatory Visit | Attending: Otolaryngology | Admitting: Otolaryngology

## 2024-04-01 ENCOUNTER — Ambulatory Visit: Payer: Self-pay

## 2024-04-01 ENCOUNTER — Encounter: Payer: Self-pay | Admitting: Family Medicine

## 2024-04-01 ENCOUNTER — Ambulatory Visit (INDEPENDENT_AMBULATORY_CARE_PROVIDER_SITE_OTHER): Admitting: Family Medicine

## 2024-04-01 VITALS — BP 118/82 | HR 57 | Ht 73.0 in | Wt 245.0 lb

## 2024-04-01 DIAGNOSIS — M9901 Segmental and somatic dysfunction of cervical region: Secondary | ICD-10-CM | POA: Diagnosis not present

## 2024-04-01 DIAGNOSIS — G5782 Other specified mononeuropathies of left lower limb: Secondary | ICD-10-CM | POA: Diagnosis not present

## 2024-04-01 DIAGNOSIS — G8929 Other chronic pain: Secondary | ICD-10-CM

## 2024-04-01 DIAGNOSIS — M9903 Segmental and somatic dysfunction of lumbar region: Secondary | ICD-10-CM | POA: Diagnosis not present

## 2024-04-01 DIAGNOSIS — M9902 Segmental and somatic dysfunction of thoracic region: Secondary | ICD-10-CM

## 2024-04-01 DIAGNOSIS — M9908 Segmental and somatic dysfunction of rib cage: Secondary | ICD-10-CM | POA: Diagnosis not present

## 2024-04-01 DIAGNOSIS — M9904 Segmental and somatic dysfunction of sacral region: Secondary | ICD-10-CM

## 2024-04-01 DIAGNOSIS — M79672 Pain in left foot: Secondary | ICD-10-CM

## 2024-04-01 DIAGNOSIS — J324 Chronic pansinusitis: Secondary | ICD-10-CM | POA: Insufficient documentation

## 2024-04-01 DIAGNOSIS — M545 Low back pain, unspecified: Secondary | ICD-10-CM

## 2024-04-01 NOTE — Assessment & Plan Note (Signed)
 Significant improvement after the injection.  Discussed icing regimen and home exercises, discussed which activities to do and which ones to avoid.  Increase activity slowly.  Follow-up again in 6 to 8 weeks.

## 2024-04-01 NOTE — Assessment & Plan Note (Signed)
 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  We discussed the possibility of different treatment options including a potential microdiscectomy which patient would like to not do.  We discussed different medications and with patient on sertraline potentially consider the change to either Cymbalta or Effexor could be beneficial for this individual.  Discussed icing regimen and home exercises, continue to work on core strengthening.  Patient declined any type of referral to a neurosurgeon to discuss any surgical options.  Follow-up with me again in 6 to 8 weeks

## 2024-04-01 NOTE — Patient Instructions (Signed)
 Got better movement today Read about Effexor or Cymbalta Foot should get better and better See me in 2-3 months

## 2024-04-03 ENCOUNTER — Encounter (HOSPITAL_BASED_OUTPATIENT_CLINIC_OR_DEPARTMENT_OTHER): Payer: Self-pay | Admitting: Family Medicine

## 2024-04-03 MED ORDER — SERTRALINE HCL 100 MG PO TABS: 100.0000 mg | ORAL_TABLET | Freq: Every day | ORAL | 1 refills | Status: AC

## 2024-04-03 MED ORDER — ALPRAZOLAM 1 MG PO TABS: 0.5000 mg | ORAL_TABLET | Freq: Every day | ORAL | 0 refills | Status: AC | PRN

## 2024-04-04 ENCOUNTER — Other Ambulatory Visit (HOSPITAL_BASED_OUTPATIENT_CLINIC_OR_DEPARTMENT_OTHER)

## 2024-04-04 LAB — CBC WITH DIFFERENTIAL/PLATELET
Basophils Absolute: 0 x10E3/uL (ref 0.0–0.2)
Basos: 1 %
EOS (ABSOLUTE): 0.3 x10E3/uL (ref 0.0–0.4)
Eos: 4 %
Hematocrit: 45.6 % (ref 37.5–51.0)
Hemoglobin: 14.7 g/dL (ref 13.0–17.7)
Immature Grans (Abs): 0 x10E3/uL (ref 0.0–0.1)
Immature Granulocytes: 0 %
Lymphocytes Absolute: 1.8 x10E3/uL (ref 0.7–3.1)
Lymphs: 28 %
MCH: 29.6 pg (ref 26.6–33.0)
MCHC: 32.2 g/dL (ref 31.5–35.7)
MCV: 92 fL (ref 79–97)
Monocytes Absolute: 0.4 x10E3/uL (ref 0.1–0.9)
Monocytes: 7 %
Neutrophils Absolute: 3.7 x10E3/uL (ref 1.4–7.0)
Neutrophils: 60 %
Platelets: 180 x10E3/uL (ref 150–450)
RBC: 4.97 x10E6/uL (ref 4.14–5.80)
RDW: 12.3 % (ref 11.6–15.4)
WBC: 6.2 x10E3/uL (ref 3.4–10.8)

## 2024-04-04 LAB — HEMOGLOBIN A1C
Est. average glucose Bld gHb Est-mCnc: 105 mg/dL
Hgb A1c MFr Bld: 5.3 % (ref 4.8–5.6)

## 2024-04-05 LAB — COMPREHENSIVE METABOLIC PANEL WITH GFR
ALT: 14 IU/L (ref 0–44)
AST: 18 IU/L (ref 0–40)
Albumin: 4.8 g/dL (ref 4.1–5.1)
Alkaline Phosphatase: 59 IU/L (ref 47–123)
BUN/Creatinine Ratio: 15 (ref 9–20)
BUN: 13 mg/dL (ref 6–24)
Bilirubin Total: 0.6 mg/dL (ref 0.0–1.2)
CO2: 27 mmol/L (ref 20–29)
Calcium: 9.9 mg/dL (ref 8.7–10.2)
Chloride: 102 mmol/L (ref 96–106)
Creatinine, Ser: 0.86 mg/dL (ref 0.76–1.27)
Globulin, Total: 1.9 g/dL (ref 1.5–4.5)
Glucose: 85 mg/dL (ref 70–99)
Potassium: 4.8 mmol/L (ref 3.5–5.2)
Sodium: 143 mmol/L (ref 134–144)
Total Protein: 6.7 g/dL (ref 6.0–8.5)
eGFR: 112 mL/min/1.73 (ref >59)

## 2024-04-05 LAB — LIPID PANEL
Chol/HDL Ratio: 3.2 ratio (ref 0.0–5.0)
Cholesterol, Total: 193 mg/dL (ref 100–199)
HDL: 60 mg/dL (ref >39)
LDL Chol Calc (NIH): 121 mg/dL — ABNORMAL HIGH (ref 0–99)
Triglycerides: 62 mg/dL (ref 0–149)
VLDL Cholesterol Cal: 12 mg/dL (ref 5–40)

## 2024-04-16 ENCOUNTER — Encounter (INDEPENDENT_AMBULATORY_CARE_PROVIDER_SITE_OTHER): Payer: Self-pay | Admitting: Otolaryngology

## 2024-04-16 ENCOUNTER — Ambulatory Visit (INDEPENDENT_AMBULATORY_CARE_PROVIDER_SITE_OTHER): Admitting: Otolaryngology

## 2024-04-16 VITALS — BP 123/75 | HR 64 | Temp 98.2°F | Ht 73.0 in | Wt 235.0 lb

## 2024-04-16 DIAGNOSIS — J3489 Other specified disorders of nose and nasal sinuses: Secondary | ICD-10-CM | POA: Diagnosis not present

## 2024-04-16 DIAGNOSIS — J343 Hypertrophy of nasal turbinates: Secondary | ICD-10-CM | POA: Diagnosis not present

## 2024-04-16 DIAGNOSIS — J342 Deviated nasal septum: Secondary | ICD-10-CM

## 2024-04-16 DIAGNOSIS — J310 Chronic rhinitis: Secondary | ICD-10-CM | POA: Insufficient documentation

## 2024-04-16 NOTE — Progress Notes (Signed)
 Patient ID: William [name], male   DOB: 09/12/1980, 43 y.o.   MRN: [id number]  Follow up: Chronic nasal obstruction, recurrent sinusitis  Discussed the use of AI scribe software for clinical note transcription with the patient, who gave verbal consent to proceed.  History of Present Illness William [name] is a 43 year old male who presents for follow-up after a sinus CT.  He has chronic nasal obstruction and recurrent sinusitis, with a history of nasal trauma from playing sports that may have contributed to his nasal issues. He experiences frequent nasal obstruction, particularly in left nostril, which often leads to sinus infections when he has colds. He describes his nose as 'always kind of stuffy' and notes that his symptoms have been a persistent part of his life.  He also has frequent facial pressure.  A sinus CT was performed as part of his workup.  The CT did not show significant sinusitis.  However, severe nasal septal deviation and inferior turbinate hypertrophy were noted.  A large left septal spur was noted to be impinging on the left inferior turbinate.  Exam: General: Communicates without difficulty, well nourished, no acute distress. Head: Normocephalic, no evidence injury, no tenderness, facial buttresses intact without stepoff. Face/sinus: No tenderness to palpation and percussion. Facial movement is normal and symmetric. Eyes: PERRL, EOMI. No scleral icterus, conjunctivae clear. Neuro: CN II exam reveals vision grossly intact.  No nystagmus at any point of gaze. Ears: Auricles well formed without lesions.  Ear canals are intact without mass or lesion.  No erythema or edema is appreciated.  The TMs are intact without fluid. Nose: External evaluation reveals normal support and skin without lesions.  Dorsum is intact.  Anterior rhinoscopy reveals congested mucosa over anterior aspect of inferior turbinates and deviated septum.  No purulence noted. Oral:  Oral cavity and oropharynx are  intact, symmetric, without erythema or edema.  Mucosa is moist without lesions. Neck: Full range of motion without pain.  There is no significant lymphadenopathy.  No masses palpable.  Thyroid bed within normal limits to palpation.  Parotid glands and submandibular glands equal bilaterally without mass.  Trachea is midline. Neuro:  CN 2-12 grossly intact.    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  More than 95% of his nasal passageways are obstructed bilaterally.  CT scan shows severe septal deviation and hypertrophy without significant infection.  Septal deviation and septal spur likely causes contact point headaches and nasal obstruction.  - The physical exam findings and the CT images are extensively reviewed with the patient. -The patient is reassured that no significant acute or chronic sinusitis is noted today. -Continue with Flonase nasal spray and nasal saline irrigation. -The surgical option of septoplasty and turbinate reduction is discussed with the patient.  The risk, benefits, alternatives, and details of the procedures are reviewed.  Questions are invited and answered. -The expected postop course is also discussed. -The patient would like to proceed with the procedures.  We [name] schedule the procedures in accordance with the patient schedule.

## 2024-05-13 ENCOUNTER — Telehealth (INDEPENDENT_AMBULATORY_CARE_PROVIDER_SITE_OTHER): Payer: Self-pay

## 2024-05-13 NOTE — Telephone Encounter (Signed)
 Pt LVM wanting to know if it will be ok for him to fly on a business trip right after post op appointment after surgery. If not he can reschedule the trip. Please advise.

## 2024-05-14 ENCOUNTER — Telehealth (INDEPENDENT_AMBULATORY_CARE_PROVIDER_SITE_OTHER): Payer: Self-pay | Admitting: Otolaryngology

## 2024-05-14 NOTE — Telephone Encounter (Signed)
 Patient called and wanted to know if he can fly  on 05/28/24 5 days after his surgery. Per Dr. Karis, I told him that it was OK.

## 2024-05-22 ENCOUNTER — Telehealth (INDEPENDENT_AMBULATORY_CARE_PROVIDER_SITE_OTHER): Payer: Self-pay | Admitting: Otolaryngology

## 2024-05-22 NOTE — Telephone Encounter (Signed)
 Received FMLA Form for patient.  Patient paid $25 Fee to complete form.  Form given to [place], Dr. Rojean MA.

## 2024-05-23 DIAGNOSIS — J343 Hypertrophy of nasal turbinates: Secondary | ICD-10-CM | POA: Diagnosis not present

## 2024-05-23 DIAGNOSIS — J342 Deviated nasal septum: Secondary | ICD-10-CM | POA: Diagnosis not present

## 2024-05-23 HISTORY — PX: NASAL SEPTOPLASTY W/ TURBINOPLASTY: SHX2070

## 2024-05-24 ENCOUNTER — Encounter (INDEPENDENT_AMBULATORY_CARE_PROVIDER_SITE_OTHER): Payer: Self-pay | Admitting: Otolaryngology

## 2024-05-27 ENCOUNTER — Ambulatory Visit (INDEPENDENT_AMBULATORY_CARE_PROVIDER_SITE_OTHER): Admitting: Otolaryngology

## 2024-05-27 ENCOUNTER — Encounter (INDEPENDENT_AMBULATORY_CARE_PROVIDER_SITE_OTHER): Payer: Self-pay | Admitting: Otolaryngology

## 2024-05-27 VITALS — BP 119/72 | HR 80 | Ht 73.0 in | Wt 235.0 lb

## 2024-05-27 DIAGNOSIS — J310 Chronic rhinitis: Secondary | ICD-10-CM

## 2024-05-27 NOTE — Progress Notes (Signed)
 Doyle splints removed. Septum and turbinates are healing well.   Both [place] debrided.  Nasal saline irrigation.  Recheck in 1 month.

## 2024-05-30 NOTE — Progress Notes (Unsigned)
 [name] [address] [place] [address] Phone: [phone] Subjective:   [name] am a scribe for Dr. Claudene.   I'm seeing this patient by the request  of:  [place], William PARAS, MD  CC: back and neck pain follow up   YEP:Dlagzrupcz  William [name] is a 43 y.o. male coming in with complaint of back and neck pain. OMT 04/01/2024. Also f/u for neuroma of L foot.  Patient states that the the back and neck are pretty good. Nothing new, Left foot seems to be getting a little bit better.   Medications patient has been prescribed: None  Taking:         Reviewed prior external information including notes and imaging from previsou exam, outside providers and external EMR if available.   As well as notes that were available from care everywhere and other healthcare systems.  Past medical history, social, surgical and family history all reviewed in electronic medical record.  No pertanent information unless stated regarding to the chief complaint.   Past Medical History:  Diagnosis Date   Anxiety 2018   Take zoloft  daily and xanax  as needed   Eczema     Allergies[1]   Review of Systems:  No headache, visual changes, nausea, vomiting, diarrhea, constipation, dizziness, abdominal pain, skin rash, fevers, chills, night sweats, weight loss, swollen lymph nodes, body aches, joint swelling, chest pain, shortness of breath, mood changes. POSITIVE muscle aches  Objective  Blood pressure 122/70, pulse 67, height 6' 1 (1.854 m), weight 253 lb (114.8 kg), SpO2 99%.   General: No apparent distress alert and oriented x3 mood and affect normal, dressed appropriately.  HEENT: Pupils equal, extraocular movements intact  Respiratory: Patient's speak in full sentences and does not appear short of breath  Cardiovascular: No lower extremity edema, non tender, no erythema  Gait MSK:  Back does have some mild loss of lordosis.  Some tenderness to  palpation in the paraspinal musculature.  Patient does have lacks last 5 degrees of extension of the back.  Decent core strength but not fantastic.  Osteopathic findings  C5 flexed rotated and side bent right T3 extended rotated and side bent right inhaled rib T9 extended rotated and side bent left L2 flexed rotated and side bent right Sacrum right on right  Limited muscular skeletal ultrasound was performed and interpreted by CLAUDENE HUSSAR, M  Limited ultrasound of patient's left foot is unremarkable at this time.   Assessment and Plan:  Low back pain Low back exam does have some loss of lordosis noted.  Some tenderness to palpation in the paraspinal musculature.  Some tightness noted still but overall doing relatively well.  Has had the Tarlov cyst but decreasing in size.  I do believe patient will do well.  Follow-up with me again in 6 to 8 weeks otherwise.    Nonallopathic problems  Decision today to treat with OMT was based on Physical Exam  After verbal consent patient was treated with HVLA, ME, FPR techniques in cervical, rib, thoracic, lumbar, and sacral  areas  Patient tolerated the procedure well with improvement in symptoms  Patient given exercises, stretches and lifestyle modifications  See medications in patient instructions if given  Patient will follow up in 4-8 weeks     The above documentation has been reviewed and is accurate and complete [name] M [name], DO       Note: This dictation was prepared with Dragon dictation along with  smaller phrase technology. Any transcriptional errors that result from this process are unintentional.            [1] No Active Allergies

## 2024-06-03 ENCOUNTER — Other Ambulatory Visit: Payer: Self-pay

## 2024-06-03 ENCOUNTER — Ambulatory Visit: Admitting: Family Medicine

## 2024-06-03 VITALS — BP 122/70 | HR 67 | Ht 73.0 in | Wt 253.0 lb

## 2024-06-03 DIAGNOSIS — M9908 Segmental and somatic dysfunction of rib cage: Secondary | ICD-10-CM

## 2024-06-03 DIAGNOSIS — G8929 Other chronic pain: Secondary | ICD-10-CM

## 2024-06-03 DIAGNOSIS — M79672 Pain in left foot: Secondary | ICD-10-CM | POA: Diagnosis not present

## 2024-06-03 DIAGNOSIS — M545 Low back pain, unspecified: Secondary | ICD-10-CM | POA: Diagnosis not present

## 2024-06-03 DIAGNOSIS — M9901 Segmental and somatic dysfunction of cervical region: Secondary | ICD-10-CM

## 2024-06-03 DIAGNOSIS — M9904 Segmental and somatic dysfunction of sacral region: Secondary | ICD-10-CM

## 2024-06-03 DIAGNOSIS — M9903 Segmental and somatic dysfunction of lumbar region: Secondary | ICD-10-CM

## 2024-06-03 DIAGNOSIS — M9902 Segmental and somatic dysfunction of thoracic region: Secondary | ICD-10-CM | POA: Diagnosis not present

## 2024-06-03 DIAGNOSIS — G5782 Other specified mononeuropathies of left lower limb: Secondary | ICD-10-CM | POA: Diagnosis not present

## 2024-06-03 NOTE — Assessment & Plan Note (Signed)
 Significant improvement at this time.  Was already getting smaller at last exam.  I think at this point can follow-up as needed.

## 2024-06-03 NOTE — Assessment & Plan Note (Signed)
 Low back exam does have some loss of lordosis noted.  Some tenderness to palpation in the paraspinal musculature.  Some tightness noted still but overall doing relatively well.  Has had the Tarlov cyst but decreasing in size.  I do believe patient will do well.  Follow-up with me again in 6 to 8 weeks otherwise.

## 2024-06-27 ENCOUNTER — Encounter (INDEPENDENT_AMBULATORY_CARE_PROVIDER_SITE_OTHER): Payer: Self-pay | Admitting: Otolaryngology

## 2024-06-27 ENCOUNTER — Ambulatory Visit (INDEPENDENT_AMBULATORY_CARE_PROVIDER_SITE_OTHER): Admitting: Otolaryngology

## 2024-06-27 VITALS — Ht 73.0 in | Wt 240.0 lb

## 2024-06-27 DIAGNOSIS — J342 Deviated nasal septum: Secondary | ICD-10-CM

## 2024-06-27 DIAGNOSIS — Z09 Encounter for follow-up examination after completed treatment for conditions other than malignant neoplasm: Secondary | ICD-10-CM

## 2024-06-27 DIAGNOSIS — J310 Chronic rhinitis: Secondary | ICD-10-CM

## 2024-06-27 NOTE — Progress Notes (Signed)
 Septum and turbinates are healing well.   Both [place] debrided.   Nasal saline irrigation as needed.   Recheck in 6 months.

## 2024-08-07 ENCOUNTER — Ambulatory Visit: Admitting: Family Medicine

## 2024-12-11 ENCOUNTER — Encounter (HOSPITAL_BASED_OUTPATIENT_CLINIC_OR_DEPARTMENT_OTHER): Admitting: Family Medicine

## 2024-12-30 ENCOUNTER — Ambulatory Visit (INDEPENDENT_AMBULATORY_CARE_PROVIDER_SITE_OTHER): Admitting: Otolaryngology
